# Patient Record
Sex: Male | Born: 1990 | Race: Black or African American | Hispanic: No | Marital: Married | Smoking: Current every day smoker
Health system: Southern US, Community
[De-identification: ages and names within clinical notes are randomized; demographics above are authoritative.]

---
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
## 2012-03-13 ENCOUNTER — Emergency Department: Payer: Self-pay | Admitting: *Deleted

## 2015-07-10 ENCOUNTER — Encounter: Payer: Self-pay | Admitting: *Deleted

## 2015-07-10 ENCOUNTER — Emergency Department
Admission: EM | Admit: 2015-07-10 | Discharge: 2015-07-10 | Disposition: A | Discharge status: Home / Self Care | Payer: BLUE CROSS/BLUE SHIELD | Attending: Emergency Medicine | Admitting: Emergency Medicine

## 2015-07-10 DIAGNOSIS — F419 Anxiety disorder, unspecified: Secondary | ICD-10-CM | POA: Insufficient documentation

## 2015-07-10 DIAGNOSIS — G43809 Other migraine, not intractable, without status migrainosus: Secondary | ICD-10-CM | POA: Insufficient documentation

## 2015-07-10 DIAGNOSIS — G43909 Migraine, unspecified, not intractable, without status migrainosus: Secondary | ICD-10-CM | POA: Diagnosis present

## 2015-07-10 DIAGNOSIS — Z720 Tobacco use: Secondary | ICD-10-CM | POA: Insufficient documentation

## 2015-07-10 MED ORDER — ONDANSETRON HCL 4 MG PO TABS: 4.0000 mg | ORAL_TABLET | Freq: Three times a day (TID) | ORAL | Status: DC | PRN

## 2015-07-10 MED ORDER — KETOROLAC TROMETHAMINE 10 MG PO TABS: 10.0000 mg | ORAL_TABLET | Freq: Three times a day (TID) | ORAL | Status: DC | PRN

## 2015-07-10 MED ORDER — ONDANSETRON 4 MG PO TBDP
4.0000 mg | ORAL_TABLET | Freq: Once | ORAL | Status: AC
  Administered 2015-07-10: 4 mg via ORAL

## 2015-07-10 MED ORDER — ONDANSETRON 4 MG PO TBDP
ORAL_TABLET | ORAL | Status: AC
  Filled 2015-07-10: qty 1

## 2015-07-10 MED ORDER — ACETAMINOPHEN 500 MG PO TABS
ORAL_TABLET | ORAL | Status: AC
  Filled 2015-07-10: qty 2

## 2015-07-10 MED ORDER — LORAZEPAM 2 MG/ML IJ SOLN
INTRAMUSCULAR | Status: AC
  Filled 2015-07-10: qty 1

## 2015-07-10 MED ORDER — ACETAMINOPHEN 500 MG PO TABS
1000.0000 mg | ORAL_TABLET | Freq: Once | ORAL | Status: AC
  Administered 2015-07-10: 1000 mg via ORAL

## 2015-07-10 MED ORDER — LORAZEPAM 2 MG/ML IJ SOLN
1.0000 mg | Freq: Once | INTRAMUSCULAR | Status: AC
  Administered 2015-07-10: 1 mg via INTRAMUSCULAR

## 2015-07-10 NOTE — ED Provider Notes (Signed)
[facility] Emergency Department Provider Note  ____________________________________________  Time seen: Approximately 9:30 PM  I have reviewed the triage vital signs and the nursing notes.   HISTORY  Chief Complaint Migraine    HPI Toribio Harbourashawn Mccowan is a 24 y.o. male , with a history of migraine, presenting for migraine. Patient reports that around 8 PM he had a progressively worsening sharp headache that is "all over." He denies any visual changes, changes in speech or gait, numbness, tingling, weakness. No recent trauma or fever, no tick bites. He took 800 mg of Motrin at home and his headache has gone from 10 out of 10-8 out of 10. He has some associated nausea but no vomiting.No photo or phonophobia.   History reviewed. No pertinent past medical history.  There are no active problems to display for this patient.   History reviewed. No pertinent past surgical history.  Current Outpatient Rx  Name  Route  Sig  Dispense  Refill  . ketorolac (TORADOL) 10 MG tablet   Oral   Take 1 tablet (10 mg total) by mouth every 8 (eight) hours as needed for moderate pain (with food).   15 tablet   0   . ondansetron (ZOFRAN) 4 MG tablet   Oral   Take 1 tablet (4 mg total) by mouth every 8 (eight) hours as needed for nausea or vomiting.   15 tablet   0     Family history; no family history of cerebral aneurysm, no family history of brain tumors or malignancies.   Allergies Review of patient's allergies indicates no known allergies.  No family history on file.  Social History Social History  Substance Use Topics  . Smoking status: Current Every Day Smoker  . Smokeless tobacco: None  . Alcohol Use: No    Review of Systems Constitutional: No fever/chills. No syncope. Eyes: No visual changes. No blurred or double vision. ENT: No sore throat. Neck; no neck stiffness or pain. Cardiovascular: Denies chest pain, palpitations. Respiratory: Denies  shortness of breath.  No cough. Gastrointestinal: No abdominal pain.  Positive nausea, no vomiting.  No diarrhea.  No constipation. Genitourinary: Negative for dysuria. Musculoskeletal: Negative for back pain. Skin: Negative for rash. Neurological: Positive for headaches, negative for focal weakness or numbness. No changes in gait. Psych: Patient reports anxiety which she reports is normal during migraine.  10-point ROS otherwise negative.  ____________________________________________   PHYSICAL EXAM:  VITAL SIGNS: ED Triage Vitals  Enc Vitals Group     BP 07/10/15 2116 127/69 mmHg     Pulse Rate 07/10/15 2116 88     Resp 07/10/15 2116 30     Temp 07/10/15 2116 98.4 F (36.9 C)     Temp Source 07/10/15 2116 Oral     SpO2 07/10/15 2116 99 %     Weight 07/10/15 2116 240 lb (108.863 kg)     Height 07/10/15 2116 6' (1.829 m)     Head Cir --      Peak Flow --      Pain Score 07/10/15 2114 10     Pain Loc --      Pain Edu? --      Excl. in GC? --      Constitutional: Patient is alert and oriented and answering questions appropriately.  He appears anxious and fidgety. Eyes: Conjunctivae are normal.  EOMI. PERRLA. No scleral icterus. Head: Atraumatic. No raccoon eyes or Battle sign. Nose: No congestion/rhinnorhea. Mouth/Throat: Mucous membranes are moist.  Neck:  No stridor.  Supple.  No neck stiffness. Full range of motion without pain. No meningismus. Cardiovascular: Normal rate, regular rhythm. No murmurs, rubs or gallops.  Respiratory: Normal respiratory effort.  No retractions. Lungs CTAB.  No wheezes, rales or ronchi. Musculoskeletal: No LE edema.  Neurologic:  Alert and oriented 3. Speech is clear. Face and smile symmetric. EOMI. PERRLA. Symmetric eyebrow raise. Good strength in all the extremities. No ataxia with walking; normal gait. Skin:  Skin is warm, dry and intact. No rash noted. Psychiatric: Anxious affect.  Normal  judgement  ____________________________________________   LABS (all labs ordered are listed, but only abnormal results are displayed)  Labs Reviewed - No data to display ____________________________________________  EKG  Not indicated ____________________________________________  RADIOLOGY  No results found.  ____________________________________________   PROCEDURES  Procedure(s) performed: None  Critical Care performed: No ____________________________________________   INITIAL IMPRESSION / ASSESSMENT AND PLAN / ED COURSE  Pertinent labs & imaging results that were available during my care of the patient were reviewed by me and considered in my medical decision making (see chart for details).  24 y.o. male with a history of migraines presenting with a headache typical for migraine that is already improving with ibuprofen that he took at home. Clinically, that he has no neurologic deficits. No history of trauma. Intracranial hemorrhage or subarachnoid hemorrhage is very unlikely. Intracranial malignancy is also unlikely. There is no clinical or historical evidence for infection such as meningitis. I will attempt to treat his migraine, if it improves anticipate discharge home.  ----------------------------------------- 10:11 PM on 07/10/2015 -----------------------------------------  Patient's mother and brother here. They do report a family history of migraines. The patient's headache is improving and he is tolerating by mouth. I will plan discharge with close PMD follow-up. ____________________________________________  FINAL CLINICAL IMPRESSION(S) / ED DIAGNOSES  Final diagnoses:  Other migraine without status migrainosus, not intractable      NEW MEDICATIONS STARTED DURING THIS VISIT:  New Prescriptions   KETOROLAC (TORADOL) 10 MG TABLET    Take 1 tablet (10 mg total) by mouth every 8 (eight) hours as needed for moderate pain (with food).   ONDANSETRON  (ZOFRAN) 4 MG TABLET    Take 1 tablet (4 mg total) by mouth every 8 (eight) hours as needed for nausea or vomiting.     Rockne Menghini, MD 07/10/15 2211

## 2015-07-10 NOTE — Discharge Instructions (Signed)
Please drink plenty of fluid to stay well hydrated. Please get plenty of rest. If you begin to develop a migraine, it is important to take pain medication early. Please take Toradol for pain, with food. If you're taking Toradol do not take other NSAID medication such as ibuprofen because it can irritate her stomach or cause bleeding. You may take Zofran for nausea. Next  Please return to the emergency department for worsening headache, nausea or vomiting, fever, changes in vision, difficulty walking, numbness, tingling, weakness, or any other symptoms concerning to you.  Migraine Headache A migraine headache is an intense, throbbing pain on one or both sides of your head. A migraine can last for 30 minutes to several hours. CAUSES  The exact cause of a migraine headache is not always known. However, a migraine may be caused when nerves in the brain become irritated and release chemicals that cause inflammation. This causes pain. Certain things may also trigger migraines, such as:  Alcohol.  Smoking.  Stress.  Menstruation.  Aged cheeses.  Foods or drinks that contain nitrates, glutamate, aspartame, or tyramine.  Lack of sleep.  Chocolate.  Caffeine.  Hunger.  Physical exertion.  Fatigue.  Medicines used to treat chest pain (nitroglycerine), birth control pills, estrogen, and some blood pressure medicines. SIGNS AND SYMPTOMS  Pain on one or both sides of your head.  Pulsating or throbbing pain.  Severe pain that prevents daily activities.  Pain that is aggravated by any physical activity.  Nausea, vomiting, or both.  Dizziness.  Pain with exposure to bright lights, loud noises, or activity.  General sensitivity to bright lights, loud noises, or smells. Before you get a migraine, you may get warning signs that a migraine is coming (aura). An aura may include:  Seeing flashing lights.  Seeing bright spots, halos, or zigzag lines.  Having tunnel vision or blurred  vision.  Having feelings of numbness or tingling.  Having trouble talking.  Having muscle weakness. DIAGNOSIS  A migraine headache is often diagnosed based on:  Symptoms.  Physical exam.  A CT scan or MRI of your head. These imaging tests cannot diagnose migraines, but they can help rule out other causes of headaches. TREATMENT Medicines may be given for pain and nausea. Medicines can also be given to help prevent recurrent migraines.  HOME CARE INSTRUCTIONS  Only take over-the-counter or prescription medicines for pain or discomfort as directed by your health care provider. The use of long-term narcotics is not recommended.  Lie down in a dark, quiet room when you have a migraine.  Keep a journal to find out what may trigger your migraine headaches. For example, write down:  What you eat and drink.  How much sleep you get.  Any change to your diet or medicines.  Limit alcohol consumption.  Quit smoking if you smoke.  Get 7-9 hours of sleep, or as recommended by your health care provider.  Limit stress.  Keep lights dim if bright lights bother you and make your migraines worse. SEEK IMMEDIATE MEDICAL CARE IF:   Your migraine becomes severe.  You have a fever.  You have a stiff neck.  You have vision loss.  You have muscular weakness or loss of muscle control.  You start losing your balance or have trouble walking.  You feel faint or pass out.  You have severe symptoms that are different from your first symptoms. MAKE SURE YOU:   Understand these instructions.  Will watch your condition.  Will get help  right away if you are not doing well or get worse.   This information is not intended to replace advice given to you by your health care provider. Make sure you discuss any questions you have with your health care provider.   Document Released: 09/12/2005 Document Revised: 10/03/2014 Document Reviewed: 05/20/2013 Elsevier Interactive Patient Education  [employer]

## 2015-07-10 NOTE — ED Notes (Addendum)
Pt reports migraine, but worse this time. Reports nausea. Pt hyperventilating in triage.

## 2015-08-12 ENCOUNTER — Emergency Department
Admission: EM | Admit: 2015-08-12 | Discharge: 2015-08-12 | Disposition: A | Discharge status: Home / Self Care | Payer: BLUE CROSS/BLUE SHIELD | Attending: Emergency Medicine | Admitting: Emergency Medicine

## 2015-08-12 ENCOUNTER — Encounter: Payer: Self-pay | Admitting: Intensive Care

## 2015-08-12 DIAGNOSIS — G43709 Chronic migraine without aura, not intractable, without status migrainosus: Secondary | ICD-10-CM | POA: Insufficient documentation

## 2015-08-12 DIAGNOSIS — F17210 Nicotine dependence, cigarettes, uncomplicated: Secondary | ICD-10-CM | POA: Diagnosis not present

## 2015-08-12 DIAGNOSIS — G43909 Migraine, unspecified, not intractable, without status migrainosus: Secondary | ICD-10-CM | POA: Diagnosis present

## 2015-08-12 MED ORDER — DIPHENHYDRAMINE HCL 50 MG/ML IJ SOLN
50.0000 mg | Freq: Once | INTRAMUSCULAR | Status: AC
  Administered 2015-08-12: 50 mg via INTRAMUSCULAR
  Filled 2015-08-12: qty 1

## 2015-08-12 MED ORDER — PROMETHAZINE HCL 25 MG/ML IJ SOLN
25.0000 mg | Freq: Once | INTRAMUSCULAR | Status: AC
  Administered 2015-08-12: 25 mg via INTRAMUSCULAR
  Filled 2015-08-12: qty 1

## 2015-08-12 MED ORDER — ASPIRIN-ACETAMINOPHEN-CAFFEINE 250-250-65 MG PO TABS: 1.0000 | ORAL_TABLET | Freq: Four times a day (QID) | ORAL | Status: DC | PRN

## 2015-08-12 MED ORDER — KETOROLAC TROMETHAMINE 30 MG/ML IJ SOLN
60.0000 mg | Freq: Once | INTRAMUSCULAR | Status: AC
  Administered 2015-08-12: 60 mg via INTRAMUSCULAR
  Filled 2015-08-12: qty 2

## 2015-08-12 NOTE — Discharge Instructions (Signed)

## 2015-08-12 NOTE — ED Provider Notes (Signed)
[facility] Emergency Department Provider Note  ____________________________________________  Time seen: Approximately 6:38 PM  I have reviewed the triage vital signs and the nursing notes.   HISTORY  Chief Complaint Migraine    HPI Randy Gillespie is a 24 y.o. male who presents to emergency department complaining of a headache. The patient states that he has a history of routine migraines and states that he ran out of his regular medication for same. He presents with a global headache. He denies any neck pain, trauma, nausea or vomiting, numbness or tingling, visual acuity changes. He states the pain is moderate, constant, and is not improving with Tylenol or ibuprofen. She states he routinely takes Excedrin but is out of same.   History reviewed. No pertinent past medical history.  There are no active problems to display for this patient.   History reviewed. No pertinent past surgical history.  Current Outpatient Rx  Name  Route  Sig  Dispense  Refill  . aspirin-acetaminophen-caffeine (EXCEDRIN MIGRAINE) 250-250-65 MG tablet   Oral   Take 1 tablet by mouth every 6 (six) hours as needed for headache.   30 tablet   0   . ketorolac (TORADOL) 10 MG tablet   Oral   Take 1 tablet (10 mg total) by mouth every 8 (eight) hours as needed for moderate pain (with food).   15 tablet   0   . ondansetron (ZOFRAN) 4 MG tablet   Oral   Take 1 tablet (4 mg total) by mouth every 8 (eight) hours as needed for nausea or vomiting.   15 tablet   0     Allergies Review of patient's allergies indicates no known allergies.  History reviewed. No pertinent family history.  Social History Social History  Substance Use Topics  . Smoking status: Current Every Day Smoker -- 1.00 packs/day    Types: Cigarettes  . Smokeless tobacco: Never Used  . Alcohol Use: Yes     Comment: Occ    Review of Systems Constitutional: No fever/chills Eyes: No visual  changes. ENT: No sore throat. Cardiovascular: Denies chest pain. Respiratory: Denies shortness of breath. Gastrointestinal: No abdominal pain.  No nausea, no vomiting.  No diarrhea.  No constipation. Genitourinary: Negative for dysuria. Musculoskeletal: Negative for back pain. Skin: Negative for rash. Neurological: Endorses headaches but denies focal weakness or numbness.  10-point ROS otherwise negative.  ____________________________________________   PHYSICAL EXAM:  VITAL SIGNS: ED Triage Vitals  Enc Vitals Group     BP 08/12/15 1835 145/59 mmHg     Pulse Rate 08/12/15 1835 63     Resp 08/12/15 1835 16     Temp 08/12/15 1835 98.4 F (36.9 C)     Temp Source 08/12/15 1835 Oral     SpO2 08/12/15 1835 99 %     Weight --      Height --      Head Cir --      Peak Flow --      Pain Score 08/12/15 1836 6     Pain Loc --      Pain Edu? --      Excl. in GC? --     Constitutional: Alert and oriented. Well appearing and in no acute distress. Eyes: Conjunctivae are normal. PERRL. EOMI. Head: Atraumatic. Nose: No congestion/rhinnorhea. Mouth/Throat: Mucous membranes are moist.  Oropharynx non-erythematous. Neck: No stridor.  No cervical spine tenderness to palpation. Cardiovascular: Normal rate, regular rhythm. Grossly normal heart sounds.  Good peripheral circulation.  Respiratory: Normal respiratory effort.  No retractions. Lungs CTAB. Gastrointestinal: Soft and nontender. No distention. No abdominal bruits. No CVA tenderness. Musculoskeletal: No lower extremity tenderness nor edema.  No joint effusions. Neurologic:  Normal speech and language. No gross focal neurologic deficits are appreciated. No gait instability. Radial nerves II through XII grossly intact. Skin:  Skin is warm, dry and intact. No rash noted. Psychiatric: Mood and affect are normal. Speech and behavior are normal.  ____________________________________________   LABS (all labs ordered are listed, but only  abnormal results are displayed)  Labs Reviewed - No data to display ____________________________________________  EKG   ____________________________________________  RADIOLOGY   ____________________________________________   PROCEDURES  Procedure(s) performed: None  Critical Care performed: No  ____________________________________________   INITIAL IMPRESSION / ASSESSMENT AND PLAN / ED COURSE  Pertinent labs & imaging results that were available during my care of the patient were reviewed by me and considered in my medical decision making (see chart for details).  She is history, symptoms, physical exam and taken into consideration for diagnosis. Patient's diagnosis is consistent with migraine headache and I advised the patient that we would provide IM injections here in the emergency department for symptomatic relief. The patient is to follow-up with neurology for further evaluation of recurring migraines. Patient is to refill his Excedrin for daily use until seeing neurology. Patient verbalizes understanding of the diagnosis and treatment plan and verbalizes compliance with same. ____________________________________________   FINAL CLINICAL IMPRESSION(S) / ED DIAGNOSES  Final diagnoses:  Chronic migraine without aura without status migrainosus, not intractable      Racheal PatchesJonathan D Norberta Stobaugh, PA-C 08/12/15 1928  Phineas SemenGraydon Goodman, MD 08/12/15 2022

## 2015-08-12 NOTE — ED Notes (Signed)
Patient c/o migraine since this morning. Patient states he was here about a month ago for the same. Patient ambulated back to RM with NAD noted

## 2016-01-04 ENCOUNTER — Emergency Department
Admission: EM | Admit: 2016-01-04 | Discharge: 2016-01-04 | Disposition: A | Discharge status: Home / Self Care | Payer: BLUE CROSS/BLUE SHIELD | Attending: Emergency Medicine | Admitting: Emergency Medicine

## 2016-01-04 ENCOUNTER — Emergency Department: Payer: BLUE CROSS/BLUE SHIELD

## 2016-01-04 ENCOUNTER — Encounter: Payer: Self-pay | Admitting: Emergency Medicine

## 2016-01-04 DIAGNOSIS — F17210 Nicotine dependence, cigarettes, uncomplicated: Secondary | ICD-10-CM | POA: Insufficient documentation

## 2016-01-04 DIAGNOSIS — R079 Chest pain, unspecified: Secondary | ICD-10-CM | POA: Insufficient documentation

## 2016-01-04 DIAGNOSIS — F1210 Cannabis abuse, uncomplicated: Secondary | ICD-10-CM | POA: Insufficient documentation

## 2016-01-04 LAB — CBC
HEMATOCRIT: 44.8 % (ref 40.0–52.0)
HEMOGLOBIN: 14.2 g/dL (ref 13.0–18.0)
MCH: 23.7 pg — ABNORMAL LOW (ref 26.0–34.0)
MCHC: 31.7 g/dL — ABNORMAL LOW (ref 32.0–36.0)
MCV: 74.7 fL — AB (ref 80.0–100.0)
Platelets: 305 10*3/uL (ref 150–440)
RBC: 6 MIL/uL — AB (ref 4.40–5.90)
RDW: 15 % — ABNORMAL HIGH (ref 11.5–14.5)
WBC: 13.5 10*3/uL — AB (ref 3.8–10.6)

## 2016-01-04 LAB — BASIC METABOLIC PANEL
ANION GAP: 9 (ref 5–15)
BUN: 15 mg/dL (ref 6–20)
CHLORIDE: 106 mmol/L (ref 101–111)
CO2: 24 mmol/L (ref 22–32)
Calcium: 9.8 mg/dL (ref 8.9–10.3)
Creatinine, Ser: 1.12 mg/dL (ref 0.61–1.24)
GFR calc non Af Amer: >60 mL/min (ref >60)
Glucose, Bld: 100 mg/dL — ABNORMAL HIGH (ref 65–99)
POTASSIUM: 3.5 mmol/L (ref 3.5–5.1)
SODIUM: 139 mmol/L (ref 135–145)

## 2016-01-04 LAB — TROPONIN I: Troponin I: <0.03 ng/mL (ref <0.031)

## 2016-01-04 MED ORDER — IBUPROFEN 800 MG PO TABS: 800.0000 mg | ORAL_TABLET | Freq: Three times a day (TID) | ORAL | Status: DC | PRN

## 2016-01-04 MED ORDER — OXYCODONE-ACETAMINOPHEN 5-325 MG PO TABS
2.0000 | ORAL_TABLET | Freq: Once | ORAL | Status: AC
  Administered 2016-01-04: 2 via ORAL
  Filled 2016-01-04: qty 2

## 2016-01-04 MED ORDER — DIAZEPAM 5 MG PO TABS: 5.0000 mg | ORAL_TABLET | Freq: Three times a day (TID) | ORAL | Status: DC | PRN

## 2016-01-04 NOTE — ED Notes (Signed)
Patient presents to the ED with chest pain that began this morning.  Patient states pain has been constant since this morning.  Patient states he has a history of chest pain since having a pulled muscle when playing football.  Patient denies movement changing the pain, denies tenderness.  Patient is having no shortness of breath.  Patient states he has never been told he has any cardiac history.

## 2016-01-04 NOTE — Discharge Instructions (Signed)

## 2016-01-04 NOTE — ED Notes (Signed)
Patient with no complaints at this time. Respirations even and unlabored. Skin warm/dry. Discharge instructions reviewed with patient at this time. Patient given opportunity to voice concerns/ask questions. Patient discharged at this time and left Emergency Department with steady gait.   

## 2016-01-04 NOTE — ED Provider Notes (Signed)
[facility] Emergency Department Provider Note     Time seen: ----------------------------------------- 7:43 PM on 01/04/2016 -----------------------------------------    I have reviewed the triage vital signs and the nursing notes.   HISTORY  Chief Complaint Chest Pain    HPI Randy Gillespie is a 25 y.o. male who presents ER for chest pain that began this morning. Patient states pain is been constant, states she has a history of chest pain since having a pulled muscle and pain football. Patient denies movement changing the pain, denies tenderness. She denies shortness of breath   History reviewed. No pertinent past medical history.  There are no active problems to display for this patient.   History reviewed. No pertinent past surgical history.  Allergies Review of patient's allergies indicates no known allergies.  Social History Social History  Substance Use Topics  . Smoking status: Current Every Day Smoker -- 1.00 packs/day    Types: Cigarettes  . Smokeless tobacco: Never Used  . Alcohol Use: Yes     Comment: Occ    Review of Systems Constitutional: Negative for fever. Eyes: Negative for visual changes. ENT: Negative for sore throat. Cardiovascular: Positive for chest pain Respiratory: Negative for shortness of breath. Gastrointestinal: Negative for abdominal pain, vomiting and diarrhea. Genitourinary: Negative for dysuria. Musculoskeletal: Negative for back pain. Skin: Negative for rash. Neurological: Negative for headaches, focal weakness or numbness.  10-point ROS otherwise negative.  ____________________________________________   PHYSICAL EXAM:  VITAL SIGNS: ED Triage Vitals  Enc Vitals Group     BP 01/04/16 1928 134/58 mmHg     Pulse Rate 01/04/16 1928 77     Resp 01/04/16 1928 20     Temp 01/04/16 1928 97.6 F (36.4 C)     Temp Source 01/04/16 1928 Oral     SpO2 01/04/16 1928 97 %     Weight 01/04/16 1928 199  lb 8 oz (90.493 kg)     Height 01/04/16 1928 6' (1.829 m)     Head Cir --      Peak Flow --      Pain Score 01/04/16 1929 6     Pain Loc --      Pain Edu? --      Excl. in GC? --    Constitutional: Alert and oriented. Well appearing and in no distress. Eyes: Conjunctivae are normal. PERRL. Normal extraocular movements. ENT   Head: Normocephalic and atraumatic.   Nose: No congestion/rhinnorhea.   Mouth/Throat: Mucous membranes are moist.   Neck: No stridor. Cardiovascular: Normal rate, regular rhythm. No murmurs, rubs, or gallops. Respiratory: Normal respiratory effort without tachypnea nor retractions. Breath sounds are clear and equal bilaterally. No wheezes/rales/rhonchi. Gastrointestinal: Soft and nontender. Normal bowel sounds Musculoskeletal: Nontender with normal range of motion in all extremities. No lower extremity tenderness nor edema. Neurologic:  Normal speech and language. No gross focal neurologic deficits are appreciated.  Skin:  Skin is warm, dry and intact. No rash noted. Psychiatric: Mood and affect are normal. Speech and behavior are normal.  ____________________________________________  EKG: Interpreted by me. Sinus rhythm with sinus arrhythmia, rate is 70 bpm, normal PR interval, normal QRS, normal QT interval. Nonspecific ST abnormality  ____________________________________________  ED COURSE:  Pertinent labs & imaging results that were available during my care of the patient were reviewed by me and considered in my medical decision making (see chart for details). Patient is in no acute distress, likely musculoskeletal. I will check basic labs and reevaluate. ____________________________________________  LABS (pertinent positives/negatives)  Labs Reviewed  BASIC METABOLIC PANEL - Abnormal; Notable for the following:    Glucose, Bld 100 (*)    All other components within normal limits  CBC - Abnormal; Notable for the following:    WBC 13.5  (*)    RBC 6.00 (*)    MCV 74.7 (*)    MCH 23.7 (*)    MCHC 31.7 (*)    RDW 15.0 (*)    All other components within normal limits  TROPONIN I    RADIOLOGY  Chest x-ray Is unremarkable ____________________________________________  FINAL ASSESSMENT AND PLAN  Chest pain  Plan: Patient with labs and imaging as dictated above. No specific etiology for his chest pain is identified. Likely musculoskeletal in origin. He'll be discharged with Motrin and Valium and encouraged to have close follow-up with a doctor.   Emily Filbert, MD   Emily Filbert, MD 01/04/16 [phone]

## 2016-01-04 NOTE — ED Notes (Signed)
Patient ambulatory to triage with steady gait, without difficulty or distress noted; pt reports left sided CP, nonradiating, with no accomp symptoms since noon

## 2016-09-18 ENCOUNTER — Encounter: Payer: Self-pay | Admitting: *Deleted

## 2016-09-18 ENCOUNTER — Emergency Department
Admission: EM | Admit: 2016-09-18 | Discharge: 2016-09-18 | Disposition: A | Discharge status: Home / Self Care | Payer: BLUE CROSS/BLUE SHIELD | Attending: Emergency Medicine | Admitting: Emergency Medicine

## 2016-09-18 ENCOUNTER — Emergency Department: Payer: BLUE CROSS/BLUE SHIELD

## 2016-09-18 DIAGNOSIS — S4991XA Unspecified injury of right shoulder and upper arm, initial encounter: Secondary | ICD-10-CM | POA: Diagnosis present

## 2016-09-18 DIAGNOSIS — W2101XA Struck by football, initial encounter: Secondary | ICD-10-CM | POA: Diagnosis not present

## 2016-09-18 DIAGNOSIS — F17210 Nicotine dependence, cigarettes, uncomplicated: Secondary | ICD-10-CM | POA: Insufficient documentation

## 2016-09-18 DIAGNOSIS — Y998 Other external cause status: Secondary | ICD-10-CM | POA: Diagnosis not present

## 2016-09-18 DIAGNOSIS — Y9361 Activity, american tackle football: Secondary | ICD-10-CM | POA: Insufficient documentation

## 2016-09-18 DIAGNOSIS — Y929 Unspecified place or not applicable: Secondary | ICD-10-CM | POA: Insufficient documentation

## 2016-09-18 MED ORDER — ONDANSETRON HCL 4 MG/2ML IJ SOLN
4.0000 mg | Freq: Once | INTRAMUSCULAR | Status: AC
  Administered 2016-09-18: 4 mg via INTRAVENOUS
  Filled 2016-09-18: qty 2

## 2016-09-18 MED ORDER — MORPHINE SULFATE (PF) 4 MG/ML IV SOLN
4.0000 mg | Freq: Once | INTRAVENOUS | Status: AC
  Administered 2016-09-18: 4 mg via INTRAVENOUS
  Filled 2016-09-18: qty 1

## 2016-09-18 MED ORDER — HYDROCODONE-ACETAMINOPHEN 5-325 MG PO TABS: 1.0000 | ORAL_TABLET | Freq: Four times a day (QID) | ORAL | 0 refills | Status: DC | PRN

## 2016-09-18 NOTE — ED Provider Notes (Addendum)
[facility] Emergency Department Provider Note  ____________________________________________   First MD Initiated Contact with Patient 09/18/16 1919     (approximate)  I have reviewed the triage vital signs and the nursing notes.   HISTORY  Chief Complaint Shoulder Injury   HPI Randy Gillespie is a 25 y.o. male without any chronic medical conditions was presenting to the emergency Department after right shoulder injury. He says that he was tackled playing football this evening and landed with all of his weight on his right shoulder. He says that he has been unable to range the arm and his right shoulder but distal to it he has full range of motion and sensation. He does not report any head injury or loss of consciousness. Says that he has not eaten anything today but had Gatorade and water to drink about 45 minutes prior to arrival.   History reviewed. No pertinent past medical history.  There are no active problems to display for this patient.   History reviewed. No pertinent surgical history.  Prior to Admission medications   Medication Sig Start Date End Date Taking? Authorizing Provider  aspirin-acetaminophen-caffeine (EXCEDRIN MIGRAINE) [phone] MG tablet Take 1 tablet by mouth every 6 (six) hours as needed for headache. 08/12/15   Delorise RoyalsJonathan D Cuthriell, PA-C  diazepam (VALIUM) 5 MG tablet Take 1 tablet (5 mg total) by mouth every 8 (eight) hours as needed for muscle spasms. 01/04/16   Emily FilbertJonathan E Williams, MD  ibuprofen (ADVIL,MOTRIN) 800 MG tablet Take 1 tablet (800 mg total) by mouth every 8 (eight) hours as needed. 01/04/16   Emily FilbertJonathan E Williams, MD  ketorolac (TORADOL) 10 MG tablet Take 1 tablet (10 mg total) by mouth every 8 (eight) hours as needed for moderate pain (with food). 07/10/15   Anne-Caroline Sharma CovertNorman, MD  ondansetron (ZOFRAN) 4 MG tablet Take 1 tablet (4 mg total) by mouth every 8 (eight) hours as needed for nausea or vomiting. 07/10/15    Rockne MenghiniAnne-Caroline Norman, MD    Allergies Patient has no known allergies.  History reviewed. No pertinent family history.  Social History Social History  Substance Use Topics  . Smoking status: Current Every Day Smoker    Packs/day: 1.00    Types: Cigarettes  . Smokeless tobacco: Never Used  . Alcohol use Yes     Comment: Occ    Review of Systems Constitutional: No fever/chills Eyes: No visual changes. ENT: No sore throat. Cardiovascular: Denies chest pain. Respiratory: Denies shortness of breath. Gastrointestinal: No abdominal pain.  No nausea, no vomiting.  No diarrhea.  No constipation. Genitourinary: Negative for dysuria. Musculoskeletal: Negative for back pain. Skin: Negative for rash. Neurological: Negative for headaches, focal weakness or numbness.  10-point ROS otherwise negative.  ____________________________________________   PHYSICAL EXAM:  VITAL SIGNS: ED Triage Vitals  Enc Vitals Group     BP 09/18/16 1911 113/77     Pulse Rate 09/18/16 1911 100     Resp 09/18/16 1911 20     Temp 09/18/16 1911 99.5 F (37.5 C)     Temp Source 09/18/16 1911 Oral     SpO2 09/18/16 1911 100 %     Weight 09/18/16 1912 240 lb (108.9 kg)     Height 09/18/16 1912 6' (1.829 m)     Head Circumference --      Peak Flow --      Pain Score 09/18/16 1913 6     Pain Loc --      Pain Edu? --  Excl. in GC? --     Constitutional: Alert and oriented. Well appearing and in no acute distress. Eyes: Conjunctivae are normal. PERRL. EOMI. Head: Atraumatic. Nose: No congestion/rhinnorhea. Mouth/Throat: Mucous membranes are moist.   Neck: No stridor.   Cardiovascular: Normal rate, regular rhythm. Grossly normal heart sounds.  Good peripheral circulation With intact right radial pulse. 5 out of 5 strength to squeeze to the right hand. Also with sensation and that is intact to light touch over the right deltoid. Respiratory: Normal respiratory effort.  No retractions. Lungs  CTAB. Gastrointestinal: Soft and nontender. No distention.  No CVA tenderness. Musculoskeletal: Right shoulder tenderness to palpation over the right ac joint. No lateral tenderness. Also tenderness over the supraspinatus muscle. Unable to adduct. No obvious deformity.  Neurovascularly intact distal to the site of the injury. Neurologic:  Normal speech and language. No gross focal neurologic deficits are appreciated.  Skin:  Skin is warm, dry and intact. No rash noted. Psychiatric: Mood and affect are normal. Speech and behavior are normal.  ____________________________________________   LABS (all labs ordered are listed, but only abnormal results are displayed)  Labs Reviewed - No data to display ____________________________________________  EKG   ____________________________________________  RADIOLOGY  DG Shoulder Right (Final result)  Result time 09/18/16 20:39:49  Final result by Herbie Baltimore, MD (09/18/16 20:39:49)           Narrative:   CLINICAL DATA: Football injury of the shoulder today with upper shoulder pain and painful range of motion.  EXAM: RIGHT SHOULDER - 2+ VIEW  COMPARISON: 01/04/2016  FINDINGS: The distal clavicle sits slightly high with respect to the acromion raises suspicion for a [place] II AC joint injury. Correlate with any point tenderness directly over the [facility] joint.  No fracture seen. Glenohumeral alignment normal.  IMPRESSION: 1. Distal clavicle sits slightly high with respect to the acromion, suspicious for a [place] II injury. Correlate with any tenderness directly over the [facility] joint.   Electronically Signed By: Gaylyn Rong M.D. On: 09/18/2016 20:39            ____________________________________________   PROCEDURES  Procedure(s) performed:   Procedures  Critical Care performed:   ____________________________________________   INITIAL IMPRESSION / ASSESSMENT AND PLAN / ED COURSE  Pertinent  labs & imaging results that were available during my care of the patient were reviewed by me and considered in my medical decision making (see chart for details).  Clinical Course   ----------------------------------------- 9:01 PM on 09/18/2016 -----------------------------------------  Patient with what appears to be an [facility] joint injury.  After morphine he does not report any pain when he is not moving the arm. We discussed icing for 48 hours and then the ability to use heat afterwards. We also discussed using a salve such as icy hot or Aspercreme. He'll be given a prescription for Norco as needed. He also knows that he will need follow-up with orthopedics for further evaluation and possible physical therapy. The patient also works Environmental manager for a living. I will be giving him a work note for the next 2 days and told him that he'll need to follow-up with orthopedics for any longer out of work note for medications. He is understanding of the plan and willing to comply.   ____________________________________________   FINAL CLINICAL IMPRESSION(S) / ED DIAGNOSES  Right acromioclavicular joint injury.    NEW MEDICATIONS STARTED DURING THIS VISIT:  New Prescriptions   No medications on file     Note:  This document  was prepared using Conservation officer, historic buildingsDragon voice recognition software and may include unintentional dictation errors.    Myrna Blazeravid Matthew Aisa Schoeppner, MD 09/18/16 2104  Patient says that he has tried Percocet in the past and they have not been effective. He is skeptical about an opioid prescription. I told him that we would give a very small amount of Norco just for breakthrough pain. I'll give him 6 tabs. He says that he also has ibuprofen 600 mg tablet at home for use.    Myrna Blazeravid Matthew Stacy Deshler, MD 09/18/16 2108

## 2016-09-18 NOTE — ED Triage Notes (Signed)
Pt playing football, sustained R shoulder injury Decreased ROM in R arm. Pt presents w/ stepoff to R shoulder. Pt has strong grip and strong palpable pulses. Last had PO food yesterday. Last PO fluid at 1730 today.

## 2017-02-13 ENCOUNTER — Emergency Department
Admission: EM | Admit: 2017-02-13 | Discharge: 2017-02-13 | Disposition: A | Discharge status: Home / Self Care | Payer: 59 | Attending: Emergency Medicine | Admitting: Emergency Medicine

## 2017-02-13 ENCOUNTER — Emergency Department: Payer: 59

## 2017-02-13 ENCOUNTER — Encounter: Payer: Self-pay | Admitting: Emergency Medicine

## 2017-02-13 DIAGNOSIS — G8929 Other chronic pain: Secondary | ICD-10-CM | POA: Insufficient documentation

## 2017-02-13 DIAGNOSIS — M25561 Pain in right knee: Secondary | ICD-10-CM | POA: Diagnosis present

## 2017-02-13 DIAGNOSIS — F17210 Nicotine dependence, cigarettes, uncomplicated: Secondary | ICD-10-CM | POA: Diagnosis not present

## 2017-02-13 MED ORDER — ETODOLAC 400 MG PO TABS: 400.0000 mg | ORAL_TABLET | Freq: Two times a day (BID) | ORAL | 0 refills | Status: DC

## 2017-02-13 NOTE — ED Triage Notes (Signed)
R knee pain x 1 1/2 year, denies injury.

## 2017-02-13 NOTE — ED Provider Notes (Signed)
[facility] Emergency Department Provider Note ____________________________________________  Time seen: 8:51 AM  I have reviewed the triage vital signs and the nursing notes.  HISTORY  Chief Complaint  Knee Pain   HPI Randy Gillespie is a 26 y.o. male is here complaining of right knee pain for 1 1/2 years.  Patient denies any injury. He states that he rarely does he take any over-the-counter medication for his pain. He states last time he took ibuprofen was a month ago. He has not seen his PCP for his chronic knee pain. Patient continues to ambulate without assistance. Patient rates his pain as 6 out of 10 at this time.   History reviewed. No pertinent past medical history.  There are no active problems to display for this patient.   History reviewed. No pertinent surgical history.  Prior to Admission medications   Medication Sig Start Date End Date Taking? Authorizing Provider  etodolac (LODINE) 400 MG tablet Take 1 tablet (400 mg total) by mouth 2 (two) times daily. 02/13/17   Randy RumpsSummers, Paloma Grange L, PA-C    Allergies Patient has no known allergies.  No family history on file.  Social History Social History  Substance Use Topics  . Smoking status: Current Every Day Smoker    Packs/day: 1.00    Types: Cigarettes  . Smokeless tobacco: Never Used  . Alcohol use Yes     Comment: Occ    Review of Systems  Constitutional: Negative for fever. Cardiovascular: Negative for chest pain. Respiratory: Negative for shortness of breath. Musculoskeletal: Positive for chronic knee pain. Skin: Negative for rash. Neurological: Negative for headaches, focal weakness or numbness. ____________________________________________  PHYSICAL EXAM:  VITAL SIGNS: ED Triage Vitals  Enc Vitals Group     BP 02/13/17 0825 (!) 114/50     Pulse Rate 02/13/17 0825 88     Resp 02/13/17 0825 20     Temp 02/13/17 0825 98.2 F (36.8 C)     Temp Source 02/13/17 0825 Oral     SpO2 02/13/17 0825 99 %     Weight 02/13/17 0827 250 lb (113.4 kg)     Height 02/13/17 0827 6' (1.829 m)     Head Circumference --      Peak Flow --      Pain Score 02/13/17 0825 6     Pain Loc --      Pain Edu? --      Excl. in GC? --     Constitutional: Alert and oriented. Well appearing and in no distress. Head: Normocephalic and atraumatic. Cardiovascular: Normal rate, regular rhythm. Normal distal pulses. Respiratory: Normal respiratory effort. No wheezes/rales/rhonchi. Musculoskeletal: On examination of the right knee and there is no gross deformity and no effusion is present. There is no crepitus with range of motion. Range of motion is minimally restricted secondary to patient's pain. Ligaments are stable bilaterally. No skin this elevation is noted. Neurologic:  Normal gait without ataxia. Normal speech and language. No gross focal neurologic deficits are appreciated. Skin:  Skin is warm, dry and intact.  Psychiatric: Mood and affect are normal. Patient exhibits appropriate insight and judgment.  RADIOLOGY Right knee x-ray per radiologist is negative I, Randy Gillespie, personally viewed and evaluated these images (plain radiographs) as part of my medical decision making, as well as reviewing the written report by the radiologist.   INITIAL IMPRESSION / ASSESSMENT AND PLAN / ED COURSE  Patient was given a prescription for etodolac 400 mg twice a day with  food. He is placed in a knee immobilizer for support. He is to follow-up with Dr. Hyacinth Meeker for any continued knee pain. He will ice and elevate as needed for pain.   ____________________________________________  FINAL CLINICAL IMPRESSION(S) / ED DIAGNOSES  Final diagnoses:  Chronic pain of right knee     Randy Rumps, PA-C 02/13/17 1449    Emily Filbert, MD 02/13/17 [phone]

## 2017-02-13 NOTE — ED Notes (Signed)
See triage note  States he developed right knee pain about 1 1/2 years ago  Unsure of injury  States pain became worse about 1 am  No swelling noted  Ambulates well to treatment area

## 2017-02-13 NOTE — ED Notes (Signed)
Pt alert and oriented X4, active, cooperative, pt in NAD. RR even and unlabored, color WNL.  Pt informed to return if any life threatening symptoms occur.   

## 2017-02-13 NOTE — Discharge Instructions (Signed)
Follow-up with Dr. Hyacinth MeekerMiller for any continued knee pain. Begin taking etodolac 400 mg twice a day with food. Wear knee immobilizer when up walking. You do not have to wear this while sleeping. Ice and elevate knee when needed.

## 2017-04-21 ENCOUNTER — Emergency Department: Payer: 59

## 2017-04-21 ENCOUNTER — Encounter: Payer: Self-pay | Admitting: Emergency Medicine

## 2017-04-21 ENCOUNTER — Emergency Department
Admission: EM | Admit: 2017-04-21 | Discharge: 2017-04-21 | Disposition: A | Discharge status: Home / Self Care | Payer: 59 | Attending: Student in an Organized Health Care Education/Training Program | Admitting: Student in an Organized Health Care Education/Training Program

## 2017-04-21 DIAGNOSIS — F439 Reaction to severe stress, unspecified: Secondary | ICD-10-CM

## 2017-04-21 DIAGNOSIS — F17210 Nicotine dependence, cigarettes, uncomplicated: Secondary | ICD-10-CM | POA: Insufficient documentation

## 2017-04-21 DIAGNOSIS — Z79899 Other long term (current) drug therapy: Secondary | ICD-10-CM | POA: Insufficient documentation

## 2017-04-21 DIAGNOSIS — R079 Chest pain, unspecified: Secondary | ICD-10-CM | POA: Diagnosis not present

## 2017-04-21 LAB — TROPONIN I

## 2017-04-21 LAB — CBC
HCT: 46.1 % (ref 40.0–52.0)
Hemoglobin: 15.2 g/dL (ref 13.0–18.0)
MCH: 23.9 pg — ABNORMAL LOW (ref 26.0–34.0)
MCHC: 32.9 g/dL (ref 32.0–36.0)
MCV: 72.8 fL — AB (ref 80.0–100.0)
PLATELETS: 276 10*3/uL (ref 150–440)
RBC: 6.34 MIL/uL — AB (ref 4.40–5.90)
RDW: 15.2 % — AB (ref 11.5–14.5)
WBC: 10 10*3/uL (ref 3.8–10.6)

## 2017-04-21 LAB — BASIC METABOLIC PANEL
Anion gap: 10 (ref 5–15)
BUN: 14 mg/dL (ref 6–20)
CALCIUM: 10.1 mg/dL (ref 8.9–10.3)
CHLORIDE: 103 mmol/L (ref 101–111)
CO2: 26 mmol/L (ref 22–32)
CREATININE: 1.1 mg/dL (ref 0.61–1.24)
GFR calc non Af Amer: >60 mL/min (ref >60)
Glucose, Bld: 103 mg/dL — ABNORMAL HIGH (ref 65–99)
Potassium: 3.9 mmol/L (ref 3.5–5.1)
SODIUM: 139 mmol/L (ref 135–145)

## 2017-04-21 MED ORDER — DIAZEPAM 5 MG PO TABS: 5.0000 mg | ORAL_TABLET | Freq: Three times a day (TID) | ORAL | 0 refills | Status: DC | PRN

## 2017-04-21 NOTE — ED Provider Notes (Signed)
[facility] Emergency Department Provider Note    None    (approximate)  I have reviewed the triage vital signs and the nursing notes.   HISTORY  Chief Complaint Chest Pain    HPI Randy Gillespie is a 26 y.o. male with midsternal chest pain and pressure that started when the patient woke up. Since been constant in nature. Patient states his been undergoing a lot of stress recently. States he's had similar chest pains in the past often what relation to anger or emotional stress. Patient states he just recently got married and his wife was not talking to him yesterday. He is not certain why this would try to talk to her throughout the day. He is concerned that she is upset with him. He is also stressed out due to not having a job and not being able to drive. No family history of early cardiac disease. He does smoke but denies any cough or shortness of breath. No SI or HI.   History reviewed. No pertinent past medical history. FMH:  History reviewed. No pertinent surgical history. There are no active problems to display for this patient.     Prior to Admission medications   Medication Sig Start Date End Date Taking? Authorizing Provider  etodolac (LODINE) 400 MG tablet Take 1 tablet (400 mg total) by mouth 2 (two) times daily. 02/13/17   Tommi RumpsSummers, Rhonda L, PA-C    Allergies Patient has no known allergies.    Social History Social History  Substance Use Topics  . Smoking status: Current Every Day Smoker    Packs/day: 1.00    Types: Cigarettes  . Smokeless tobacco: Never Used  . Alcohol use Yes     Comment: Occ    Review of Systems Patient denies headaches, rhinorrhea, blurry vision, numbness, shortness of breath, chest pain, edema, cough, abdominal pain, nausea, vomiting, diarrhea, dysuria, fevers, rashes or hallucinations unless otherwise stated above in HPI. ____________________________________________   PHYSICAL EXAM:  VITAL  SIGNS: Vitals:   04/21/17 1648  BP: 111/77  Pulse: 80  Resp: 14  Temp: 98.3 F (36.8 C)    Constitutional: Alert and oriented. Well appearing and in no acute distress. Eyes: Conjunctivae are normal.  Head: Atraumatic. Nose: No congestion/rhinnorhea. Mouth/Throat: Mucous membranes are moist.   Neck: No stridor. Painless ROM.  Cardiovascular: Normal rate, regular rhythm. Grossly normal heart sounds.  Good peripheral circulation. Respiratory: Normal respiratory effort.  No retractions. Lungs CTAB. Gastrointestinal: Soft and nontender. No distention. No abdominal bruits. No CVA tenderness. Genitourinary:  Musculoskeletal: No lower extremity tenderness nor edema.  No joint effusions. Neurologic:  Normal speech and language. No gross focal neurologic deficits are appreciated. No facial droop Skin:  Skin is warm, dry and intact. No rash noted. Psychiatric: Mood and affect are normal. Speech and behavior are normal.  ____________________________________________   LABS (all labs ordered are listed, but only abnormal results are displayed)  Results for orders placed or performed during the hospital encounter of 04/21/17 (from the past 24 hour(s))  Basic metabolic panel     Status: Abnormal   Collection Time: 04/21/17  4:52 PM  Result Value Ref Range   Sodium 139 135 - 145 mmol/L   Potassium 3.9 3.5 - 5.1 mmol/L   Chloride 103 101 - 111 mmol/L   CO2 26 22 - 32 mmol/L   Glucose, Bld 103 (H) 65 - 99 mg/dL   BUN 14 6 - 20 mg/dL   Creatinine, Ser 4.541.10 0.61 - 1.24  mg/dL   Calcium 40.910.1 8.9 - 81.110.3 mg/dL   GFR calc non Af Amer >60 >60 mL/min   GFR calc Af Amer >60 >60 mL/min   Anion gap 10 5 - 15  CBC     Status: Abnormal   Collection Time: 04/21/17  4:52 PM  Result Value Ref Range   WBC 10.0 3.8 - 10.6 K/uL   RBC 6.34 (H) 4.40 - 5.90 MIL/uL   Hemoglobin 15.2 13.0 - 18.0 g/dL   HCT 91.446.1 78.240.0 - 95.652.0 %   MCV 72.8 (L) 80.0 - 100.0 fL   MCH 23.9 (L) 26.0 - 34.0 pg   MCHC 32.9 32.0 -  36.0 g/dL   RDW 21.315.2 (H) 08.611.5 - 57.814.5 %   Platelets 276 150 - 440 K/uL  Troponin I     Status: None   Collection Time: 04/21/17  4:52 PM  Result Value Ref Range   Troponin I <0.03 <0.03 ng/mL   ____________________________________________  EKG My review and personal interpretation at Time: 16:56   Indication: chest pain  Rate: 85  Rhythm: sinus Axis: normal Other: no stemi, normal intervals, no depressions ____________________________________________  RADIOLOGY  I personally reviewed all radiographic images ordered to evaluate for the above acute complaints and reviewed radiology reports and findings.  These findings were personally discussed with the patient.  Please see medical record for radiology report.  ____________________________________________   PROCEDURES  Procedure(s) performed:  Procedures    Critical Care performed: no ____________________________________________   INITIAL IMPRESSION / ASSESSMENT AND PLAN / ED COURSE  Pertinent labs & imaging results that were available during my care of the patient were reviewed by me and considered in my medical decision making (see chart for details).  DDX: ACS, pericarditis, esophagitis, boerhaaves, pe, dissection, pna, bronchitis, costochondritis   Randy Gillespie is a 26 y.o. who presents to the ED with chest pain that seems primarily stress driven. EKG shows no evidence of acute ischemia. His troponin is negative. His heart score is 1. Patient is low risk by well's and is PERC neg.  abdominal exam soft and benign. There is no evidence of asthma exacerbation or acute bronchitis. No evidence pneumothorax or pneumonia. She'll be given referral for outpatient counseling.  Have discussed with the patient and available family all diagnostics and treatments performed thus far and all questions were answered to the best of my ability. The patient demonstrates understanding and agreement with plan.        ____________________________________________   FINAL CLINICAL IMPRESSION(S) / ED DIAGNOSES  Final diagnoses:  Chest pain, unspecified type  Stress      NEW MEDICATIONS STARTED DURING THIS VISIT:  New Prescriptions   No medications on file     Note:  This document was prepared using Dragon voice recognition software and may include unintentional dictation errors.    Willy Eddyobinson, Nazarene Bunning, MD 04/21/17 2031

## 2017-04-21 NOTE — ED Triage Notes (Signed)
C/O left sided chest pain.  Pain started today after patient woke up this morning.

## 2017-10-02 ENCOUNTER — Other Ambulatory Visit: Payer: Self-pay

## 2017-10-02 ENCOUNTER — Emergency Department: Payer: Medicaid Other

## 2017-10-02 ENCOUNTER — Emergency Department
Admission: EM | Admit: 2017-10-02 | Discharge: 2017-10-02 | Disposition: A | Discharge status: Home / Self Care | Payer: Medicaid Other | Attending: Emergency Medicine | Admitting: Emergency Medicine

## 2017-10-02 DIAGNOSIS — S6991XA Unspecified injury of right wrist, hand and finger(s), initial encounter: Secondary | ICD-10-CM | POA: Diagnosis present

## 2017-10-02 DIAGNOSIS — Y999 Unspecified external cause status: Secondary | ICD-10-CM | POA: Diagnosis not present

## 2017-10-02 DIAGNOSIS — S63501A Unspecified sprain of right wrist, initial encounter: Secondary | ICD-10-CM | POA: Insufficient documentation

## 2017-10-02 DIAGNOSIS — F17210 Nicotine dependence, cigarettes, uncomplicated: Secondary | ICD-10-CM | POA: Diagnosis not present

## 2017-10-02 DIAGNOSIS — Z79899 Other long term (current) drug therapy: Secondary | ICD-10-CM | POA: Diagnosis not present

## 2017-10-02 DIAGNOSIS — Y929 Unspecified place or not applicable: Secondary | ICD-10-CM | POA: Diagnosis not present

## 2017-10-02 DIAGNOSIS — Y33XXXA Other specified events, undetermined intent, initial encounter: Secondary | ICD-10-CM | POA: Insufficient documentation

## 2017-10-02 DIAGNOSIS — Y9371 Activity, boxing: Secondary | ICD-10-CM | POA: Insufficient documentation

## 2017-10-02 MED ORDER — NAPROXEN 500 MG PO TABS: 500.0000 mg | ORAL_TABLET | Freq: Two times a day (BID) | ORAL | 2 refills | Status: DC

## 2017-10-02 NOTE — ED Provider Notes (Signed)
[facility] Emergency Department Provider Note   ____________________________________________    I have reviewed the triage vital signs and the nursing notes.   HISTORY  Chief Complaint Wrist Pain     HPI Randy Gillespie is a 27 y.o. male who presents with complaints of right wrist pain.  Patient reports he was boxing with his brother yesterday.  Wrist started hurting this morning, pain is worse with dorsiflexing his wrist and to palpation.  No bruising or significant swelling.  Has not taken anything for it   History reviewed. No pertinent past medical history.  There are no active problems to display for this patient.   History reviewed. No pertinent surgical history.  Prior to Admission medications   Medication Sig Start Date End Date Taking? Authorizing Provider  diazepam (VALIUM) 5 MG tablet Take 1 tablet (5 mg total) by mouth every 8 (eight) hours as needed for anxiety. 04/21/17 04/21/18  Willy Eddy, MD  etodolac (LODINE) 400 MG tablet Take 1 tablet (400 mg total) by mouth 2 (two) times daily. 02/13/17   Tommi Rumps, PA-C  naproxen (NAPROSYN) 500 MG tablet Take 1 tablet (500 mg total) by mouth 2 (two) times daily with a meal. 10/02/17   Jene Every, MD     Allergies Patient has no known allergies.  History reviewed. No pertinent family history.  Social History Social History   Tobacco Use  . Smoking status: Current Every Day Smoker    Packs/day: 1.00    Types: Cigarettes  . Smokeless tobacco: Never Used  Substance Use Topics  . Alcohol use: Yes    Comment: Occ  . Drug use: No    Review of Systems  Constitutional: No fever/chills  ENT: No sore throat.   Gastrointestinal: No abdominal pain.  No nausea, no vomiting.   Genitourinary: Negative for dysuria. Musculoskeletal: Negative for back pain. Skin: Negative for rash. Neurological: Negative for headaches      ____________________________________________   PHYSICAL EXAM:  VITAL SIGNS: ED Triage Vitals  Enc Vitals Group     BP 10/02/17 1857 139/80     Pulse Rate 10/02/17 1855 93     Resp 10/02/17 1855 18     Temp 10/02/17 1855 98.6 F (37 C)     Temp Source 10/02/17 1855 Oral     SpO2 10/02/17 1855 98 %     Weight --      Height 10/02/17 1856 1.829 m (6')     Head Circumference --      Peak Flow --      Pain Score 10/02/17 1855 7     Pain Loc --      Pain Edu? --      Excl. in GC? --      Constitutional: Alert and oriented. No acute distress. Pleasant and interactive    Cardiovascular: Normal rate, regular rhythm.  Respiratory: Normal respiratory effort.  No retractions. Genitourinary: deferred Musculoskeletal: Mild right wrist tenderness to palpation dorsally, no pain with dorsiflexion, no bony a normalities or significant swelling or redness Neurologic:  Normal speech and language. No gross focal neurologic deficits are appreciated.   Skin:  Skin is warm, dry and intact. No rash noted.   ____________________________________________   LABS (all labs ordered are listed, but only abnormal results are displayed)  Labs Reviewed - No data to display ____________________________________________  EKG   ____________________________________________  RADIOLOGY  X-rays negative for fracture ____________________________________________   PROCEDURES  Procedure(s) performed: No  Procedures  Critical Care performed: No ____________________________________________   INITIAL IMPRESSION / ASSESSMENT AND PLAN / ED COURSE  Pertinent labs & imaging results that were available during my care of the patient were reviewed by me and considered in my medical decision making (see chart for details).  Consistent with wrist sprain, premade splint given/Ace wrap as needed.  Recommend ice, NSAIDs   ____________________________________________   FINAL CLINICAL  IMPRESSION(S) / ED DIAGNOSES  Final diagnoses:  Sprain of right wrist, initial encounter      NEW MEDICATIONS STARTED DURING THIS VISIT:  This SmartLink is deprecated. Use AVSMEDLIST instead to display the medication list for a patient.   Note:  This document was prepared using Dragon voice recognition software and may include unintentional dictation errors.    Jene EveryKinner, Avonte Sensabaugh, MD 10/02/17 2040

## 2017-10-02 NOTE — ED Triage Notes (Signed)
Pt states he was boxing last night and started having R wrist pain and R hand pain. Appears swollen. States pain increased d/t going to work today and using hand. Able to wiggle fingers. Hx of break in R wrist.

## 2017-10-02 NOTE — ED Notes (Signed)
Pt signed esignature.  D/c  inst to pt.  

## 2017-12-30 ENCOUNTER — Encounter: Payer: Self-pay | Admitting: Emergency Medicine

## 2017-12-30 DIAGNOSIS — R51 Headache: Secondary | ICD-10-CM | POA: Insufficient documentation

## 2017-12-30 DIAGNOSIS — Z5321 Procedure and treatment not carried out due to patient leaving prior to being seen by health care provider: Secondary | ICD-10-CM | POA: Insufficient documentation

## 2017-12-30 NOTE — ED Triage Notes (Signed)
Pt states he has headache, has not taken tylenol or ibuprofen at home.  Pt denies any sensoy changes.  Pt states pain is 6/10.

## 2017-12-31 ENCOUNTER — Emergency Department
Admission: EM | Admit: 2017-12-31 | Discharge: 2017-12-31 | Disposition: A | Discharge status: Home / Self Care | Payer: Medicaid Other | Attending: Emergency Medicine | Admitting: Emergency Medicine

## 2018-01-12 ENCOUNTER — Emergency Department: Payer: Medicaid Other

## 2018-01-12 ENCOUNTER — Emergency Department
Admission: EM | Admit: 2018-01-12 | Discharge: 2018-01-12 | Disposition: A | Discharge status: Home / Self Care | Payer: Medicaid Other | Attending: Emergency Medicine | Admitting: Emergency Medicine

## 2018-01-12 ENCOUNTER — Other Ambulatory Visit: Payer: Self-pay

## 2018-01-12 DIAGNOSIS — R0789 Other chest pain: Secondary | ICD-10-CM | POA: Insufficient documentation

## 2018-01-12 DIAGNOSIS — F17210 Nicotine dependence, cigarettes, uncomplicated: Secondary | ICD-10-CM | POA: Diagnosis not present

## 2018-01-12 DIAGNOSIS — Z79899 Other long term (current) drug therapy: Secondary | ICD-10-CM | POA: Diagnosis not present

## 2018-01-12 DIAGNOSIS — R079 Chest pain, unspecified: Secondary | ICD-10-CM | POA: Diagnosis present

## 2018-01-12 LAB — CBC
HCT: 42.5 % (ref 40.0–52.0)
HEMOGLOBIN: 13.8 g/dL (ref 13.0–18.0)
MCH: 23.8 pg — AB (ref 26.0–34.0)
MCHC: 32.4 g/dL (ref 32.0–36.0)
MCV: 73.5 fL — ABNORMAL LOW (ref 80.0–100.0)
PLATELETS: 264 10*3/uL (ref 150–440)
RBC: 5.78 MIL/uL (ref 4.40–5.90)
RDW: 14.1 % (ref 11.5–14.5)
WBC: 7.7 10*3/uL (ref 3.8–10.6)

## 2018-01-12 LAB — BASIC METABOLIC PANEL
ANION GAP: 5 (ref 5–15)
BUN: 21 mg/dL — ABNORMAL HIGH (ref 6–20)
CALCIUM: 9.2 mg/dL (ref 8.9–10.3)
CO2: 28 mmol/L (ref 22–32)
CREATININE: 1.19 mg/dL (ref 0.61–1.24)
Chloride: 106 mmol/L (ref 101–111)
Glucose, Bld: 102 mg/dL — ABNORMAL HIGH (ref 65–99)
Potassium: 3.8 mmol/L (ref 3.5–5.1)
Sodium: 139 mmol/L (ref 135–145)

## 2018-01-12 LAB — TROPONIN I

## 2018-01-12 MED ORDER — IBUPROFEN 600 MG PO TABS: 600.0000 mg | ORAL_TABLET | Freq: Four times a day (QID) | ORAL | 0 refills | Status: DC | PRN

## 2018-01-12 MED ORDER — DIAZEPAM 5 MG PO TABS: 5.0000 mg | ORAL_TABLET | Freq: Three times a day (TID) | ORAL | 0 refills | Status: DC | PRN

## 2018-01-12 NOTE — Discharge Instructions (Signed)
Return to the ER for new, worsening, or persistent severe chest pain, difficulty breathing, weakness or lightheadedness, or any other new or worsening symptoms that concern you.  You may take the prescribed ibuprofen for pain, as well as the Valium as a muscle relaxant.  Do not take the Valium when driving or operating machinery.

## 2018-01-12 NOTE — ED Triage Notes (Signed)
Pt reports right sided chest pain that started while he was working, pt states no injury, denies cough or congestion, pt reports chest pain intermittently since he was young without diagnosis

## 2018-01-12 NOTE — ED Provider Notes (Signed)
[facility] Emergency Department Provider Note ____________________________________________   First MD Initiated Contact with Patient 01/12/18 1319     (approximate)  I have reviewed the triage vital signs and the nursing notes.   HISTORY  Chief Complaint Chest Pain    HPI Randy Gillespie is a 27 y.o. male with no significant PMH who presents with chest pain, acute onset this morning, mainly right-sided and around his pectoral muscle, and not associated with nausea, vomiting, or shortness of breath.  He does report some lightheadedness which has resolved.  He reports an extensive history of similar pain in the past, for which he has had negative work-ups.  He denies leg pain or swelling.   No past medical history on file.  There are no active problems to display for this patient.   No past surgical history on file.  Prior to Admission medications   Medication Sig Start Date End Date Taking? Authorizing Provider  diazepam (VALIUM) 5 MG tablet Take 1 tablet (5 mg total) by mouth every 8 (eight) hours as needed for anxiety or muscle spasms. 01/12/18 02/11/18  Dionne Bucy, MD  etodolac (LODINE) 400 MG tablet Take 1 tablet (400 mg total) by mouth 2 (two) times daily. 02/13/17   Tommi Rumps, PA-C  ibuprofen (ADVIL,MOTRIN) 600 MG tablet Take 1 tablet (600 mg total) by mouth every 6 (six) hours as needed. 01/12/18   Dionne Bucy, MD  naproxen (NAPROSYN) 500 MG tablet Take 1 tablet (500 mg total) by mouth 2 (two) times daily with a meal. 10/02/17   Jene Every, MD    Allergies Patient has no known allergies.  No family history on file.  Social History Social History   Tobacco Use  . Smoking status: Current Every Day Smoker    Packs/day: 1.00    Types: Cigarettes  . Smokeless tobacco: Never Used  Substance Use Topics  . Alcohol use: Yes    Comment: Occ  . Drug use: No    Types: Marijuana    Review of Systems  Constitutional:  No fever. Eyes: No redness. ENT: No neck pain. Cardiovascular: Positive for chest pain. Respiratory: Denies shortness of breath. Gastrointestinal: No vomiting.  Genitourinary: Negative for flank pain.  Musculoskeletal: Negative for back pain. Skin: Negative for rash. Neurological: Negative for headache.   ____________________________________________   PHYSICAL EXAM:  VITAL SIGNS: ED Triage Vitals  Enc Vitals Group     BP 01/12/18 1115 122/61     Pulse Rate 01/12/18 1115 88     Resp 01/12/18 1115 18     Temp 01/12/18 1115 98.4 F (36.9 C)     Temp Source 01/12/18 1115 Oral     SpO2 01/12/18 1115 97 %     Weight 01/12/18 1116 255 lb (115.7 kg)     Height 01/12/18 1116 6' (1.829 m)     Head Circumference --      Peak Flow --      Pain Score 01/12/18 1115 6     Pain Loc --      Pain Edu? --      Excl. in GC? --     Constitutional: Alert and oriented. Well appearing and in no acute distress. Eyes: Conjunctivae are normal.  Head: Atraumatic. Nose: No congestion/rhinnorhea. Mouth/Throat: Mucous membranes are moist.   Neck: Normal range of motion.  Cardiovascular: Normal rate, regular rhythm. Grossly normal heart sounds.  Good peripheral circulation.  Chest wall nontender. Respiratory: Normal respiratory effort.  No retractions. Lungs  CTAB. Gastrointestinal: No distention.  Musculoskeletal: No lower extremity edema.  Extremities warm and well perfused.  No calf or popliteal swelling or tenderness. Neurologic:  Normal speech and language. No gross focal neurologic deficits are appreciated.  Skin:  Skin is warm and dry. No rash noted. Psychiatric: Mood and affect are normal. Speech and behavior are normal.  ____________________________________________   LABS (all labs ordered are listed, but only abnormal results are displayed)  Labs Reviewed  BASIC METABOLIC PANEL - Abnormal; Notable for the following components:      Result Value   Glucose, Bld 102 (*)    BUN 21  (*)    All other components within normal limits  CBC - Abnormal; Notable for the following components:   MCV 73.5 (*)    MCH 23.8 (*)    All other components within normal limits  TROPONIN I   ____________________________________________  EKG  ED ECG REPORT I, Dionne BucySebastian Vivia Rosenburg, the attending physician, personally viewed and interpreted this ECG.  Date: 01/12/2018 EKG Time: 1106 Rate: 86 Rhythm: normal sinus rhythm QRS Axis: normal Intervals: normal ST/T Wave abnormalities: normal Narrative Interpretation: no evidence of acute ischemia  ____________________________________________  RADIOLOGY  CXR: No focal infiltrate or other acute findings  ____________________________________________   PROCEDURES  Procedure(s) performed: No  Procedures  Critical Care performed: No ____________________________________________   INITIAL IMPRESSION / ASSESSMENT AND PLAN / ED COURSE  Pertinent labs & imaging results that were available during my care of the patient were reviewed by me and considered in my medical decision making (see chart for details).  27 year old male with PMH as noted above including multiple prior episodes of chest pain for which she has had negative work-ups presents with chest pain this morning while he was at work, associated with lightheadedness but no other significant symptoms.  Is now mostly resolved.  It feels like the pain he has had in the past.  On exam, the patient is well-appearing, vitals are normal, and the remainder the exam is unremarkable.  EKG is nonischemic, his chest x-ray is negative, and first troponin is also negative.  There is no clinical evidence for ACS or indication for further observation or repeat enzymes given his overall very low risk.  Patient is also PERC negative.  The patient reports that the medication he was given the last time he had this was helpful.  I reviewed the past medical records in epic and confirmed that the  patient was last here for this in 2018, was thought to have anxiety, and was given Valium.  Patient does not appear particularly anxious right now, but this is certainly on the differential.  I am also considering muscular or chest wall pain given the location.  I will prescribe NSAIDs, as well as a small quantity of a low dose of Valium primarily for the muscle relaxant effect.  Return precautions given, and the patient expresses understanding.  I will provide referral for primary care, as patient states he does not have a primary care provider.   ____________________________________________   FINAL CLINICAL IMPRESSION(S) / ED DIAGNOSES  Final diagnoses:  Atypical chest pain      NEW MEDICATIONS STARTED DURING THIS VISIT:  New Prescriptions   DIAZEPAM (VALIUM) 5 MG TABLET    Take 1 tablet (5 mg total) by mouth every 8 (eight) hours as needed for anxiety or muscle spasms.   IBUPROFEN (ADVIL,MOTRIN) 600 MG TABLET    Take 1 tablet (600 mg total) by mouth every 6 (six) hours  as needed.     Note:  This document was prepared using Dragon voice recognition software and may include unintentional dictation errors.     Dionne Bucy, MD 01/12/18 1410

## 2018-01-30 ENCOUNTER — Emergency Department
Admission: EM | Admit: 2018-01-30 | Discharge: 2018-01-30 | Disposition: A | Discharge status: Home / Self Care | Payer: Medicaid Other | Attending: Emergency Medicine | Admitting: Emergency Medicine

## 2018-01-30 ENCOUNTER — Other Ambulatory Visit: Payer: Self-pay

## 2018-01-30 ENCOUNTER — Emergency Department: Payer: Medicaid Other

## 2018-01-30 DIAGNOSIS — S8392XA Sprain of unspecified site of left knee, initial encounter: Secondary | ICD-10-CM | POA: Insufficient documentation

## 2018-01-30 DIAGNOSIS — Y999 Unspecified external cause status: Secondary | ICD-10-CM | POA: Insufficient documentation

## 2018-01-30 DIAGNOSIS — X509XXA Other and unspecified overexertion or strenuous movements or postures, initial encounter: Secondary | ICD-10-CM | POA: Diagnosis not present

## 2018-01-30 DIAGNOSIS — F17210 Nicotine dependence, cigarettes, uncomplicated: Secondary | ICD-10-CM | POA: Insufficient documentation

## 2018-01-30 DIAGNOSIS — Y9333 Activity, BASE jumping: Secondary | ICD-10-CM | POA: Diagnosis not present

## 2018-01-30 DIAGNOSIS — Y9289 Other specified places as the place of occurrence of the external cause: Secondary | ICD-10-CM | POA: Diagnosis not present

## 2018-01-30 DIAGNOSIS — S8992XA Unspecified injury of left lower leg, initial encounter: Secondary | ICD-10-CM | POA: Diagnosis present

## 2018-01-30 MED ORDER — IBUPROFEN 800 MG PO TABS: 800.0000 mg | ORAL_TABLET | Freq: Three times a day (TID) | ORAL | 0 refills | Status: DC

## 2018-01-30 NOTE — Discharge Instructions (Addendum)
Follow-up with Dr. Ernest Pine if any continued problems with your knee in 5 to 7 days.  Wear your knee immobilizer that you have at home a time that you are walking.  Ice and elevation when sitting.  Begin taking ibuprofen 800 mg 3 times daily with food.

## 2018-01-30 NOTE — ED Triage Notes (Signed)
[place], jumping around a lot and is having left knee pain today,. States it is hard to bend it.Marland Kitchen

## 2018-01-30 NOTE — ED Notes (Signed)
See triage note  Presents with left knee pain  States he did not fall on to knee but did a lot of jumping   May have twisted the knee

## 2018-01-30 NOTE — ED Provider Notes (Signed)
[facility] Emergency Department Provider Note  ____________________________________________   First MD Initiated Contact with Patient 01/30/18 [phone]     (approximate)  I have reviewed the triage vital signs and the nursing notes.   HISTORY  Chief Complaint Knee Pain   HPI Randy Gillespie is a 27 y.o. male of left knee pain.  Patient states that he was at his son's birthday party last evening was jumping around a lot.  He denies any known injury to his knee.  He states that today is hard to bend his knee due to pain.  He denies any previous problems with his knee.  Patient has not taken any over-the-counter medication for this.  Currently rates his pain as 7 out of 10.  History reviewed. No pertinent past medical history.  There are no active problems to display for this patient.   No past surgical history on file.  Prior to Admission medications   Medication Sig Start Date End Date Taking? Authorizing Provider  ibuprofen (ADVIL,MOTRIN) 800 MG tablet Take 1 tablet (800 mg total) by mouth 3 (three) times daily. 01/30/18   Tommi Rumps, PA-C    Allergies Patient has no known allergies.  No family history on file.  Social History Social History   Tobacco Use  . Smoking status: Current Every Day Smoker    Packs/day: 1.00    Types: Cigarettes  . Smokeless tobacco: Never Used  Substance Use Topics  . Alcohol use: Yes    Comment: Occ  . Drug use: No    Types: Marijuana    Review of Systems Constitutional: No fever/chills Cardiovascular: Denies chest pain. Respiratory: Denies shortness of breath. Gastrointestinal:  No nausea, no vomiting.  Musculoskeletal: Positive for left knee pain. Skin: Negative for rash. Neurological: Negative for headaches, focal weakness or numbness. ___________________________________________   PHYSICAL EXAM:  VITAL SIGNS: ED Triage Vitals  Enc Vitals Group     BP 01/30/18 0750 (!) 115/50     Pulse Rate  01/30/18 0748 78     Resp 01/30/18 0748 16     Temp 01/30/18 0748 98.1 F (36.7 C)     Temp Source 01/30/18 0748 Oral     SpO2 01/30/18 0748 98 %     Weight 01/30/18 0749 255 lb (115.7 kg)     Height 01/30/18 0749 6' (1.829 m)     Head Circumference --      Peak Flow --      Pain Score 01/30/18 0749 7     Pain Loc --      Pain Edu? --      Excl. in GC? --    Constitutional: Alert and oriented. Well appearing and in no acute distress. Eyes: Conjunctivae are normal. Head: Atraumatic. Neck: No stridor.   Cardiovascular: Normal rate, regular rhythm. Grossly normal heart sounds.  Good peripheral circulation. Respiratory: Normal respiratory effort.  No retractions. Lungs CTAB. Musculoskeletal: On examination of the left knee there is no gross deformity and no obvious effusion present.  There is tenderness on palpation of the medial aspect without soft tissue swelling.  Ligaments are stable bilaterally.  No ecchymosis or abrasions were seen.  Range of motion is slightly restricted secondary to patient's discomfort. Neurologic:  Normal speech and language. No gross focal neurologic deficits are appreciated.  Skin:  Skin is warm, dry and intact.  No ecchymosis, erythema or abrasions were seen. Psychiatric: Mood and affect are normal. Speech and behavior are normal.  ____________________________________________  LABS (all labs ordered are listed, but only abnormal results are displayed)  Labs Reviewed - No data to display  RADIOLOGY  ED MD interpretation:  Left knee x-ray is negative for fracture or dislocation.  Official radiology report(s): Dg Knee Complete 4 Views Left  Result Date: 01/30/2018 CLINICAL DATA:  Left knee pain after over exertion. Difficulty bending the knee. EXAM: LEFT KNEE - COMPLETE 4+ VIEW COMPARISON:  None. FINDINGS: The bones are subjectively adequately mineralized. The joint spaces are well maintained. There is no acute fracture or dislocation. There is no  significant osteophyte formation. There is no joint effusion. IMPRESSION: There is no acute or significant chronic bony abnormality of the left knee. Electronically Signed   By: David  [place] M.D.   On: 01/30/2018 08:46    ____________________________________________   PROCEDURES  Procedure(s) performed: None  Procedures  Critical Care performed: No  ____________________________________________   INITIAL IMPRESSION / ASSESSMENT AND PLAN / ED COURSE Patient is here with complaint of left knee pain since jumping last evening at his son's birthday party.  X-rays are reassuring that there was no bony injury.  Patient currently has a knee immobilizer at home and will wear this.  He was encouraged to ice and elevate as needed for pain or swelling.  He was given a prescription for ibuprofen 800 mg 3 times daily with food.  He is to follow-up with Dr. Ernest Pine if any continued problems with his knee.  ____________________________________________   FINAL CLINICAL IMPRESSION(S) / ED DIAGNOSES  Final diagnoses:  Sprain of left knee, unspecified ligament, initial encounter     ED Discharge Orders        Ordered    ibuprofen (ADVIL,MOTRIN) 800 MG tablet  3 times daily     01/30/18 0915       Note:  This document was prepared using Dragon voice recognition software and may include unintentional dictation errors.    Tommi Rumps, PA-C 01/30/18 1047    Jeanmarie Plant, MD 01/30/18 [phone]

## 2018-09-03 IMAGING — DX DG KNEE COMPLETE 4+V*L*
4 series · 4 of 4 positions shown · non-contrast
Comparison: None.

CLINICAL DATA: Left knee pain after over exertion. Difficulty
bending the knee.

EXAM:
LEFT KNEE - COMPLETE 4+ VIEW

[knee ap]
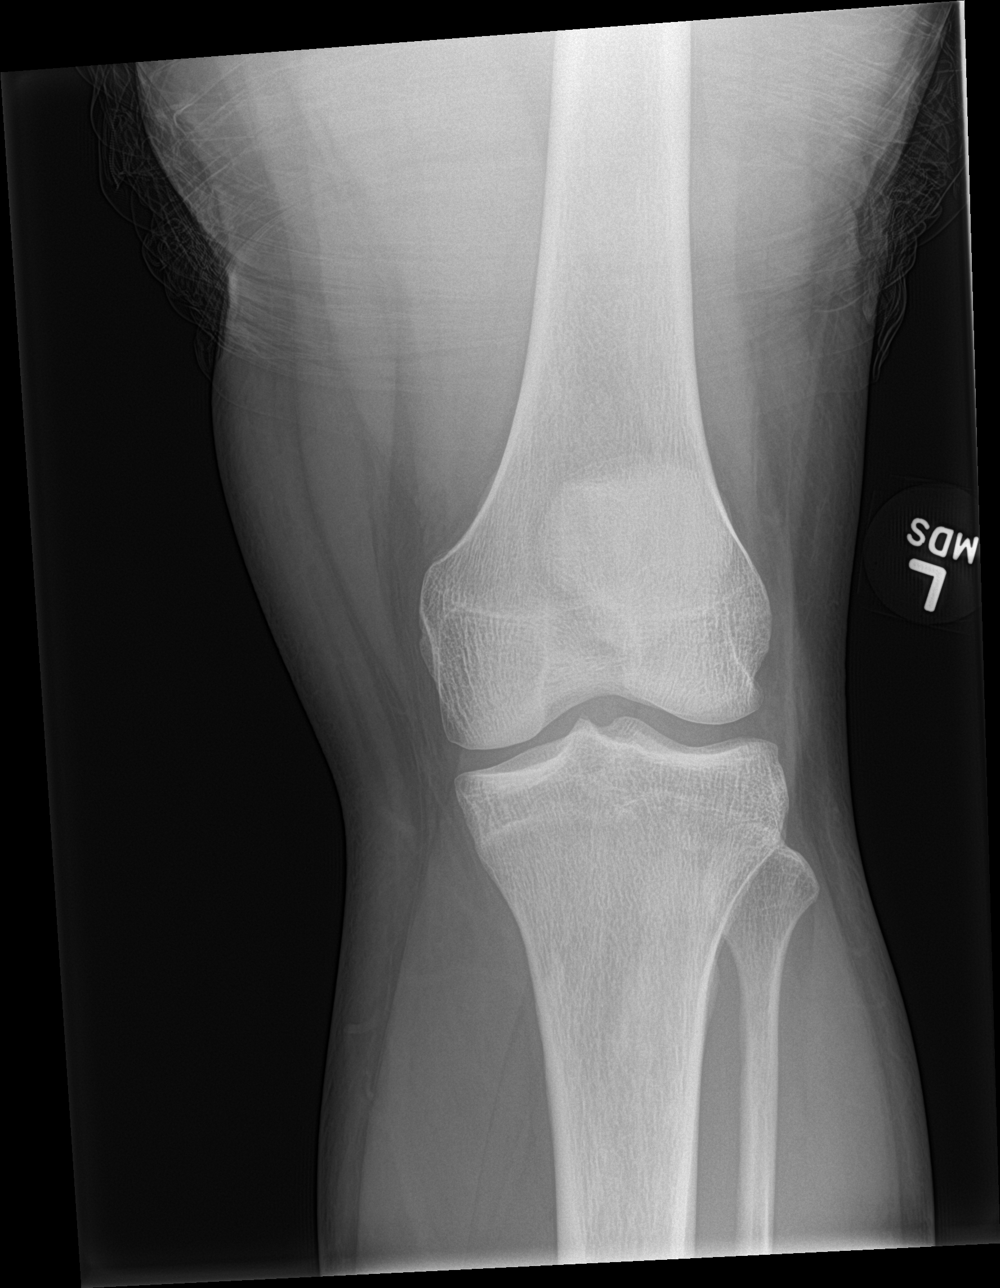

[knee tunnel]
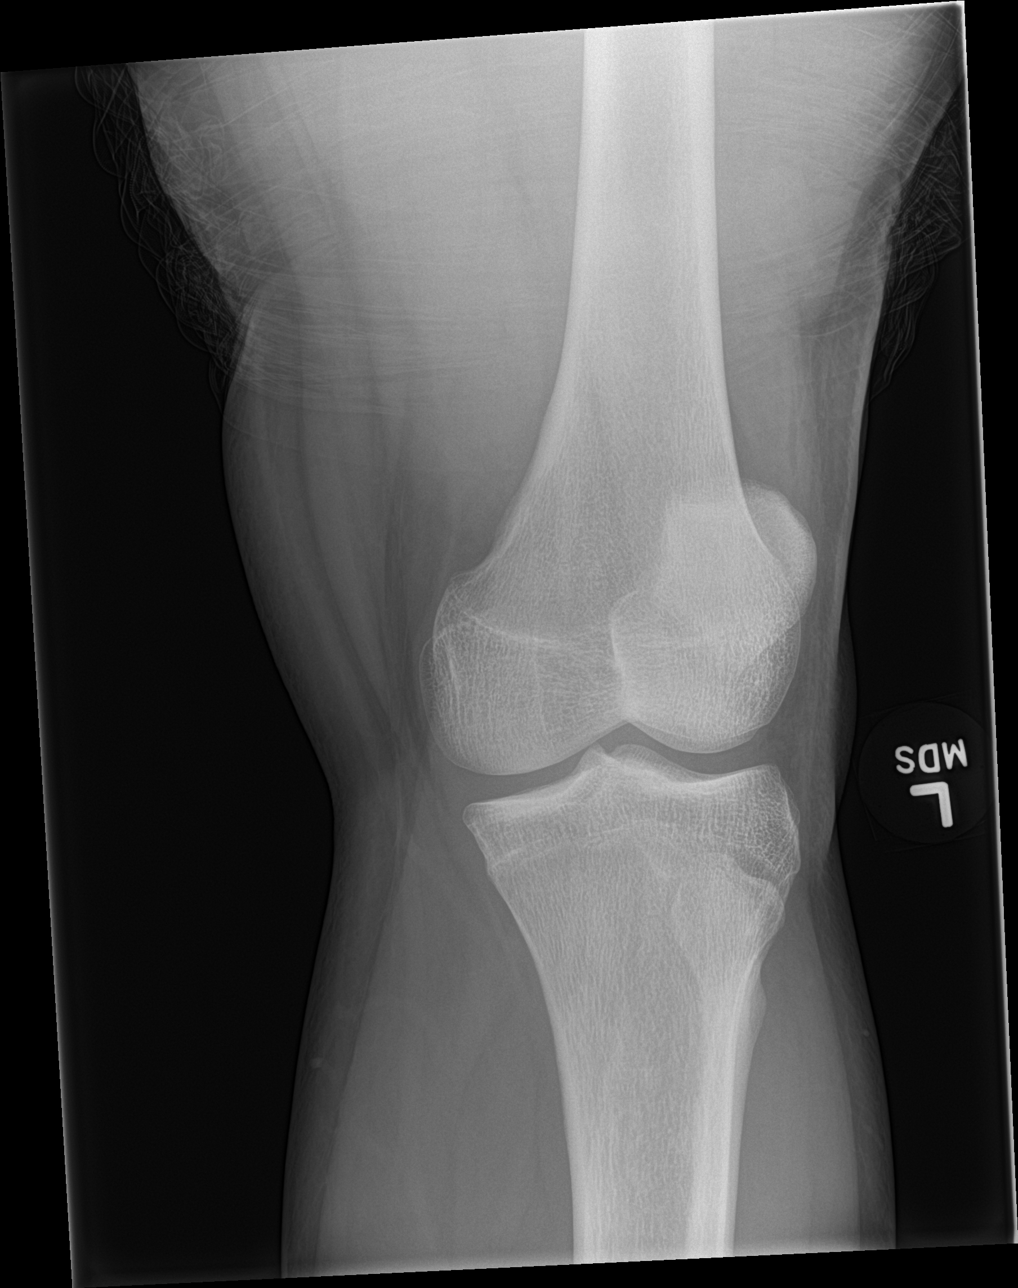

[knee lat]
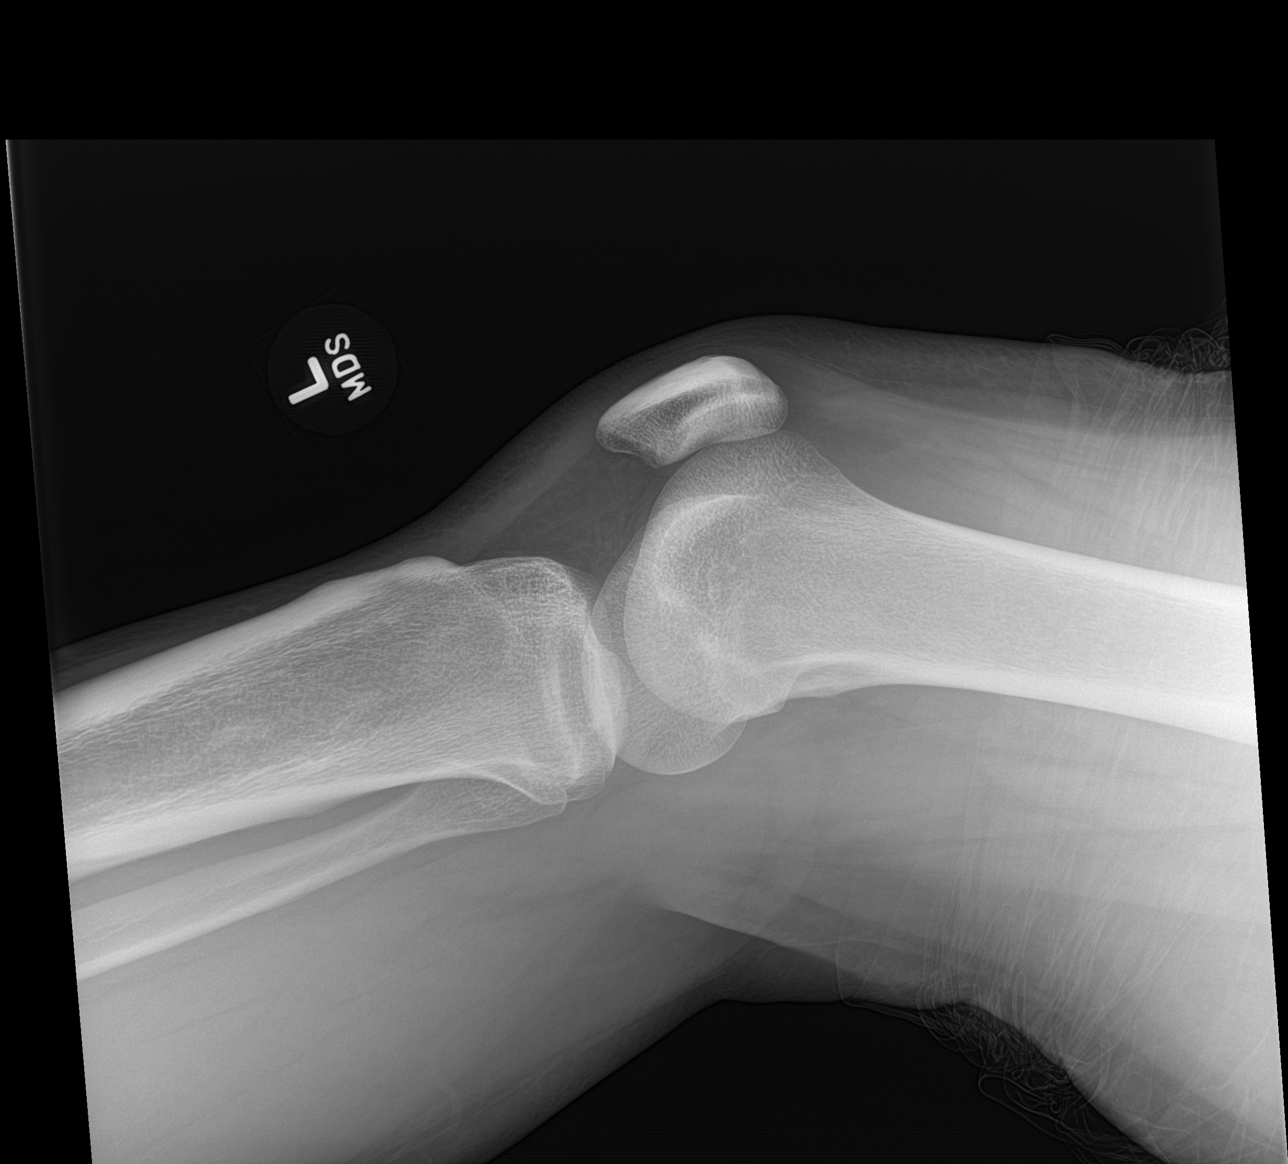

[knee obl]
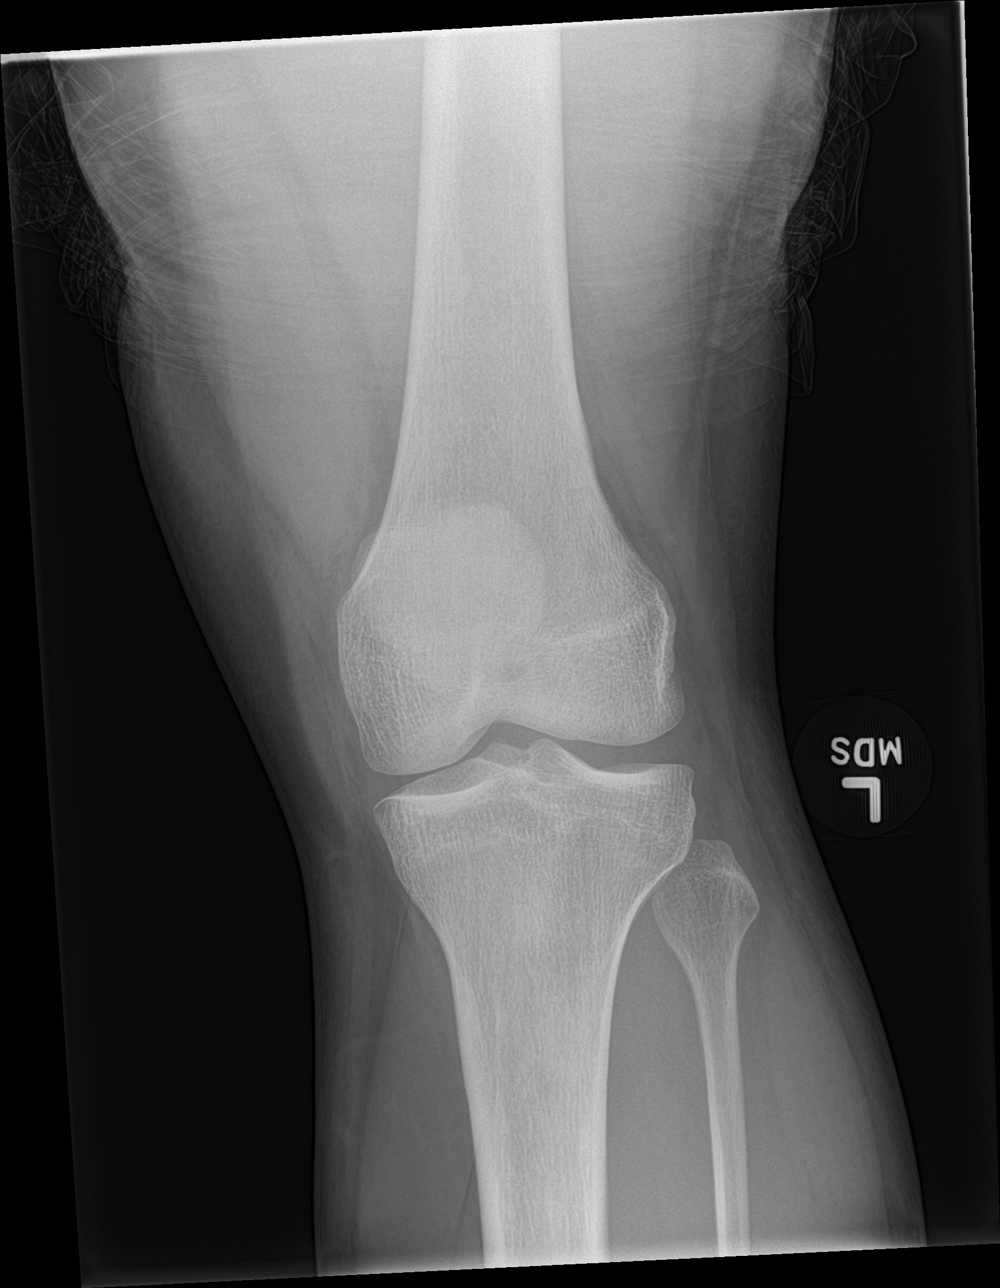

[4 of 4 positions shown; findings below may reference images not displayed]

FINDINGS: The bones are subjectively adequately mineralized. The joint spaces
are well maintained. There is no acute fracture or dislocation.
There is no significant osteophyte formation. There is no joint
effusion.
IMPRESSION: There is no acute or significant chronic bony abnormality of the
left knee.

## 2019-05-28 ENCOUNTER — Emergency Department: Payer: Medicaid Other

## 2019-05-28 ENCOUNTER — Other Ambulatory Visit: Payer: Self-pay

## 2019-05-28 ENCOUNTER — Encounter: Payer: Self-pay | Admitting: Emergency Medicine

## 2019-05-28 DIAGNOSIS — Z79899 Other long term (current) drug therapy: Secondary | ICD-10-CM | POA: Insufficient documentation

## 2019-05-28 DIAGNOSIS — R51 Headache: Secondary | ICD-10-CM | POA: Insufficient documentation

## 2019-05-28 DIAGNOSIS — R55 Syncope and collapse: Secondary | ICD-10-CM | POA: Insufficient documentation

## 2019-05-28 DIAGNOSIS — F17210 Nicotine dependence, cigarettes, uncomplicated: Secondary | ICD-10-CM | POA: Insufficient documentation

## 2019-05-28 LAB — CBC WITH DIFFERENTIAL/PLATELET
Abs Immature Granulocytes: 0.02 10*3/uL (ref 0.00–0.07)
Basophils Absolute: 0 10*3/uL (ref 0.0–0.1)
Basophils Relative: 0 %
Eosinophils Absolute: 0 10*3/uL (ref 0.0–0.5)
Eosinophils Relative: 0 %
HCT: 43.2 % (ref 39.0–52.0)
Hemoglobin: 14 g/dL (ref 13.0–17.0)
Immature Granulocytes: 0 %
Lymphocytes Relative: 37 %
Lymphs Abs: 3.5 10*3/uL (ref 0.7–4.0)
MCH: 24.3 pg — ABNORMAL LOW (ref 26.0–34.0)
MCHC: 32.4 g/dL (ref 30.0–36.0)
MCV: 74.9 fL — ABNORMAL LOW (ref 80.0–100.0)
Monocytes Absolute: 0.4 10*3/uL (ref 0.1–1.0)
Monocytes Relative: 5 %
Neutro Abs: 5.5 10*3/uL (ref 1.7–7.7)
Neutrophils Relative %: 58 %
Platelets: 313 10*3/uL (ref 150–400)
RBC: 5.77 MIL/uL (ref 4.22–5.81)
RDW: 14.8 % (ref 11.5–15.5)
WBC: 9.5 10*3/uL (ref 4.0–10.5)
nRBC: 0 % (ref 0.0–0.2)

## 2019-05-28 LAB — COMPREHENSIVE METABOLIC PANEL
ALT: 17 U/L (ref 0–44)
AST: 22 U/L (ref 15–41)
Albumin: 4.6 g/dL (ref 3.5–5.0)
Alkaline Phosphatase: 74 U/L (ref 38–126)
Anion gap: 10 (ref 5–15)
BUN: 17 mg/dL (ref 6–20)
CO2: 27 mmol/L (ref 22–32)
Calcium: 9.5 mg/dL (ref 8.9–10.3)
Chloride: 102 mmol/L (ref 98–111)
Creatinine, Ser: 1.03 mg/dL (ref 0.61–1.24)
GFR calc Af Amer: >60 mL/min (ref >60)
GFR calc non Af Amer: >60 mL/min (ref >60)
Glucose, Bld: 105 mg/dL — ABNORMAL HIGH (ref 70–99)
Potassium: 3.3 mmol/L — ABNORMAL LOW (ref 3.5–5.1)
Sodium: 139 mmol/L (ref 135–145)
Total Bilirubin: 0.9 mg/dL (ref 0.3–1.2)
Total Protein: 8.3 g/dL — ABNORMAL HIGH (ref 6.5–8.1)

## 2019-05-28 LAB — TROPONIN I (HIGH SENSITIVITY): Troponin I (High Sensitivity): 4 ng/L (ref <18)

## 2019-05-28 NOTE — ED Notes (Signed)
Lab results and scans reviewed.

## 2019-05-28 NOTE — ED Triage Notes (Signed)
Patient ambulatory to triage with steady gait, without difficulty or distress noted; pt reports rt sided HA since last night; st syncopal episode & fell last Monday in [place] and again last night while standing outside behind car; st hx HA; denies any recent illness; denies any accomp symptoms at present

## 2019-05-29 ENCOUNTER — Emergency Department
Admission: EM | Admit: 2019-05-29 | Discharge: 2019-05-29 | Discharge status: Against Medical Advice | Payer: Medicaid Other | Attending: Emergency Medicine | Admitting: Emergency Medicine

## 2019-05-29 DIAGNOSIS — R55 Syncope and collapse: Secondary | ICD-10-CM

## 2019-05-29 DIAGNOSIS — R519 Headache, unspecified: Secondary | ICD-10-CM

## 2019-05-29 NOTE — Discharge Instructions (Signed)
You are electing to leave.  We offered CTA versus lumbar puncture to rule out subarachnoid hemorrhage; bleeding in the brain.  You have declined this at this time.  We given you neurologist number to follow-up if you continue to have headaches.  Do not take ibuprofen, NSAIDs or BC powder.   For your syncope we discussed possibly needing a echocardiogram and Holter monitor.  You should follow-up with cardiology for these.  Return to ER if you decide to have further work-up or especially if you have another episode of syncope.

## 2019-05-29 NOTE — ED Provider Notes (Signed)
[facility] Emergency Department Provider Note  ____________________________________________   First MD Initiated Contact with Patient 05/29/19 0032     (approximate)  I have reviewed the triage vital signs and the nursing notes.   HISTORY  Chief Complaint Loss of Consciousness    HPI Randy Gillespie is a 28 y.o. male who is otherwise healthy presents with syncope.  Patient presents with two episodes of syncope.  The first episode of syncope happened a week ago where he was outside.  He said he felt hot and lightheaded and all of a sudden collapsed.  He then had another episode 2 days ago on Monday where he developed a severe headache and then about an hour later he was in the bathroom trying to take Tylenol and he suddenly collapsed again.  He endorses having headaches and migraines frequently however this headache was more severe than normal, came on all of a sudden, nothing made it better, nothing made it worse.  His headache did go away but then came back this morning which is why he presented to the emergency room.  He denies fevers, history of anyone dying really early or having early heart problems.    History reviewed. No pertinent past medical history.  There are no active problems to display for this patient.   History reviewed. No pertinent surgical history.  Prior to Admission medications   Medication Sig Start Date End Date Taking? Authorizing Provider  ibuprofen (ADVIL,MOTRIN) 800 MG tablet Take 1 tablet (800 mg total) by mouth 3 (three) times daily. 01/30/18   Tommi Rumps, PA-C    Allergies Patient has no known allergies.  No family history on file.  Social History Social History   Tobacco Use   Smoking status: Current Every Day Smoker    Packs/day: 1.00    Types: Cigarettes   Smokeless tobacco: Never Used  Substance Use Topics   Alcohol use: Yes    Comment: Occ   Drug use: No    Types: Marijuana      Review of  Systems Constitutional: No fever/chills Eyes: No visual changes. ENT: No sore throat. Cardiovascular: Denies chest pain. Respiratory: Denies shortness of breath. Gastrointestinal: No abdominal pain.  No nausea, no vomiting.  No diarrhea.  No constipation. Genitourinary: Negative for dysuria. Musculoskeletal: Negative for back pain. Skin: Negative for rash. Neurological: Positive for headache and syncope All other ROS negative ____________________________________________   PHYSICAL EXAM:  VITAL SIGNS: ED Triage Vitals  Enc Vitals Group     BP 05/28/19 2051 (!) 132/106     Pulse Rate 05/28/19 2051 84     Resp 05/28/19 2051 20     Temp 05/28/19 2051 98.5 F (36.9 C)     Temp Source 05/28/19 2051 Oral     SpO2 05/28/19 2051 100 %     Weight 05/28/19 2051 260 lb (117.9 kg)     Height 05/28/19 2051 6' (1.829 m)     Head Circumference --      Peak Flow --      Pain Score 05/28/19 2050 6     Pain Loc --      Pain Edu? --      Excl. in GC? --     Constitutional: Alert and oriented. Well appearing and in no acute distress. Eyes: Conjunctivae are normal. EOMI. Head: Atraumatic. Nose: No congestion/rhinnorhea. Mouth/Throat: Mucous membranes are moist.   Neck: No stridor. Trachea Midline. FROM Cardiovascular: Normal rate, regular rhythm. Grossly normal heart sounds.  Good peripheral circulation. Respiratory: Normal respiratory effort.  No retractions. Lungs CTAB. Gastrointestinal: Soft and nontender. No distention. No abdominal bruits.  Musculoskeletal: No lower extremity tenderness nor edema.  No joint effusions. Neurologic: Cranial nerves II through XII are intact. Skin:  Skin is warm, dry and intact. No rash noted. Psychiatric: Mood and affect are normal. Speech and behavior are normal. GU: Deferred   ____________________________________________   LABS (all labs ordered are listed, but only abnormal results are displayed)  Labs Reviewed  CBC WITH DIFFERENTIAL/PLATELET  - Abnormal; Notable for the following components:      Result Value   MCV 74.9 (*)    MCH 24.3 (*)    All other components within normal limits  COMPREHENSIVE METABOLIC PANEL - Abnormal; Notable for the following components:   Potassium 3.3 (*)    Glucose, Bld 105 (*)    Total Protein 8.3 (*)    All other components within normal limits  URINALYSIS, COMPLETE (UACMP) WITH MICROSCOPIC  URINE DRUG SCREEN, QUALITATIVE ([facility] ONLY)  TROPONIN I (HIGH SENSITIVITY)   ____________________________________________   ED ECG REPORT I, Concha SeMary E Ramond Darnell, the attending physician, personally viewed and interpreted this ECG.  EKG is normal sinus rate of 72, 1 mm ST elevation in V2 through V6, 2 and aVF most likely secondary to early repolarization, normal intervals, no T wave inversion ____________________________________________  RADIOLOGY I, Concha SeMary E Sheala Dosh, personally viewed and evaluated these images (plain radiographs) as part of my medical decision making, as well as reviewing the written report by the radiologist.  ED MD interpretation: No pneumonia  Official radiology report(s): Dg Chest 2 View  Result Date: 05/28/2019 CLINICAL DATA:  pt reports rt sided HA since last night; st syncopal episode AND fell last Monday in [place] and again last night while standing outside behind car; st hx HA EXAM: CHEST - 2 VIEW COMPARISON:  Chest radiographs 01/12/2018, 04/21/2017 FINDINGS: The heart size and mediastinal contours are within normal limits. The lungs are clear. No pneumothorax or pleural effusion. The visualized skeletal structures are unremarkable. IMPRESSION: Normal chest radiograph. Electronically Signed   By: Emmaline KluverNancy  Ballantyne M.D.   On: 05/28/2019 21:27   Ct Head Wo Contrast  Result Date: 05/28/2019 CLINICAL DATA:  Patient ambulatory to triage with steady gait, without difficulty or distress noted; pt reports rt sided HA since last night; st syncopal episode AND fell last Monday in [place] EXAM: CT HEAD  WITHOUT CONTRAST TECHNIQUE: Contiguous axial images were obtained from the base of the skull through the vertex without intravenous contrast. COMPARISON:  None. FINDINGS: Brain: No evidence of acute infarction, hemorrhage, hydrocephalus, extra-axial collection or mass lesion/mass effect. Vascular: No hyperdense vessel or unexpected calcification. Skull: Normal. Negative for fracture or focal lesion. Sinuses/Orbits: No acute finding. Other: None. IMPRESSION: No acute intracranial process. Electronically Signed   By: Emmaline KluverNancy  Ballantyne M.D.   On: 05/28/2019 21:25    ____________________________________________   PROCEDURES  Procedure(s) performed (including Critical Care):  Procedures   ____________________________________________   INITIAL IMPRESSION / ASSESSMENT AND PLAN / ED COURSE  Chelsey Dolloff was evaluated in Emergency Department on 05/29/2019 for the symptoms described in the history of present illness. He was evaluated in the context of the global COVID-19 pandemic, which necessitated consideration that the patient might be at risk for infection with the SARS-CoV-2 virus that causes COVID-19. Institutional protocols and algorithms that pertain to the evaluation of patients at risk for COVID-19 are in a state of rapid change based on information released by  regulatory bodies including the CDC and federal and state organizations. These policies and algorithms were followed during the patient's care in the ED.    Patient presents with 2 episodes of syncope and headache.  This most concerning for subarachnoid hemorrhage.  I had lengthy discussion with patient that the CT head would only a low sensitivity given he is now 2 days out from the headache, however CT head was ordered in triage to evaluate for intracranial hemorrhage, mass.  Also given the sudden onset of passing out without any prodrome a second time consider heart issues such as ACS, HOCM.  Will get EKG and cardiac marker to  further evaluate.  Will get labs to evaluate for anemia although lower suspicion given no symptoms to suggest this.   White count is normal no evidence of infection.  Hemoglobin is normal therefore low suspicion for bleed.  Slightly low potassium at 3.3 but normal kidney function.  Cardiac markers are negative.  Chest x-ray without evidence of pneumonia.  CT scan without evidence of intracranial hemorrhage.   I discussed with patient that the CT scan has a lower sensitivity given this happened on Monday and my concern for subarachnoid hemorrhage.  I discussed getting a lumbar puncture as the gold standard but patient declined.  I then offered CTA to rule out aneurysm.  Patient discussed with his wife over the phone who said that she wanted him to come home and not get any further work-up. I really encouraged pt to at least get the CTA but pt declined again.  I discussed the risk of death and permanent disability if this is secondary to a bleed.  Patient expressed understanding but said that he did not want further work-up at this time.  Patient has capacity make this decision.  I also discussed the possibility of this being related to his heart and offered admission for cardiac monitoring and echocardiogram.  Again patient declined this as well.  Will give patient neurology and cardiology's phone number for follow-up.  I discussed avoiding NSAIDs and ibuprofen for the headaches and to return to the ER if he elects to have further work-up or if it happens again.    ____________________________________________   FINAL CLINICAL IMPRESSION(S) / ED DIAGNOSES   Final diagnoses:  Syncope and collapse  Headache disorder      MEDICATIONS GIVEN DURING THIS VISIT:  Medications - No data to display   ED Discharge Orders    None       Note:  This document was prepared using Dragon voice recognition software and may include unintentional dictation errors.   Vanessa Muldraugh, MD 05/29/19 [phone]

## 2019-08-29 ENCOUNTER — Other Ambulatory Visit: Payer: Self-pay

## 2019-08-29 ENCOUNTER — Emergency Department
Admission: EM | Admit: 2019-08-29 | Discharge: 2019-08-29 | Discharge status: Against Medical Advice | Payer: Medicaid Other | Attending: Emergency Medicine | Admitting: Emergency Medicine

## 2019-08-29 ENCOUNTER — Emergency Department: Payer: Medicaid Other

## 2019-08-29 DIAGNOSIS — F17210 Nicotine dependence, cigarettes, uncomplicated: Secondary | ICD-10-CM | POA: Insufficient documentation

## 2019-08-29 DIAGNOSIS — Z5321 Procedure and treatment not carried out due to patient leaving prior to being seen by health care provider: Secondary | ICD-10-CM | POA: Insufficient documentation

## 2019-08-29 DIAGNOSIS — R0789 Other chest pain: Secondary | ICD-10-CM | POA: Insufficient documentation

## 2019-08-29 LAB — CBC
HCT: 40.8 % (ref 39.0–52.0)
Hemoglobin: 13.4 g/dL (ref 13.0–17.0)
MCH: 23.9 pg — ABNORMAL LOW (ref 26.0–34.0)
MCHC: 32.8 g/dL (ref 30.0–36.0)
MCV: 72.9 fL — ABNORMAL LOW (ref 80.0–100.0)
Platelets: 274 10*3/uL (ref 150–400)
RBC: 5.6 MIL/uL (ref 4.22–5.81)
RDW: 14.8 % (ref 11.5–15.5)
WBC: 11.2 10*3/uL — ABNORMAL HIGH (ref 4.0–10.5)
nRBC: 0 % (ref 0.0–0.2)

## 2019-08-29 LAB — BASIC METABOLIC PANEL
Anion gap: 8 (ref 5–15)
BUN: 19 mg/dL (ref 6–20)
CO2: 27 mmol/L (ref 22–32)
Calcium: 9.4 mg/dL (ref 8.9–10.3)
Chloride: 105 mmol/L (ref 98–111)
Creatinine, Ser: 1.21 mg/dL (ref 0.61–1.24)
GFR calc Af Amer: >60 mL/min (ref >60)
GFR calc non Af Amer: >60 mL/min (ref >60)
Glucose, Bld: 84 mg/dL (ref 70–99)
Potassium: 3.7 mmol/L (ref 3.5–5.1)
Sodium: 140 mmol/L (ref 135–145)

## 2019-08-29 LAB — TROPONIN I (HIGH SENSITIVITY): Troponin I (High Sensitivity): 4 ng/L (ref <18)

## 2019-08-29 MED ORDER — SODIUM CHLORIDE 0.9% FLUSH: 3.0000 mL | Freq: Once | INTRAVENOUS | Status: DC

## 2019-08-29 NOTE — ED Notes (Signed)
No answer when called several times from lobby 

## 2019-08-29 NOTE — ED Provider Notes (Signed)
[facility] Emergency Department Provider Note  ____________________________________________  Time seen: Approximately 6:21 PM  The following is a medical screening exam note. It is intended that the patient await an ER room assignment for detailed exam, diagnosis, and disposition.  I have reviewed the triage vital signs.    HISTORY  Chief Complaint No chief complaint on file.   HPI Randy Gillespie is a 28 y.o. male presents to the emergency department for treatment and evaluation of chest pain. Pain is in the upper left chest. Started last night. Pain has been constant and is like a cramp. No worse with movement. No relief with Excedrin.  Usually ibuprofen 600mg  helps, but he is out.   No past medical history on file.  There are no active problems to display for this patient.   No past surgical history on file.  Prior to Admission medications   Medication Sig Start Date End Date Taking? Authorizing Provider  ibuprofen (ADVIL,MOTRIN) 800 MG tablet Take 1 tablet (800 mg total) by mouth 3 (three) times daily. 01/30/18   Johnn Hai, PA-C    Allergies Patient has no known allergies.  No family history on file.  Social History Social History   Tobacco Use  . Smoking status: Current Every Day Smoker    Packs/day: 1.00    Types: Cigarettes  . Smokeless tobacco: Never Used  Substance Use Topics  . Alcohol use: Yes    Comment: Occ  . Drug use: No    Types: Marijuana    Review of Systems Constitutional: Negative for fever. Cardiac: Positive for chest pain. ENT: Negative for sore throat. Respiratory: Negative for shortness of breath. Gastrointestinal: No abdominal pain.  No nausea, no vomiting.  No diarrhea.  Musculoskeletal: Negative for generalized body aches. Skin: Negative for rash/lesion/wound. Neurological: Negative for headaches, focal weakness or numbness.  ____________________________________________   PHYSICAL EXAM:  VITAL  SIGNS:  Today's Vitals   08/29/19 1824 08/29/19 1825  BP: 113/72   Pulse: 69   Resp: 18   Temp: 97.9 F (36.6 C)   TempSrc: Oral   SpO2: 100%   Weight:  99.8 kg  Height:  6' (1.829 m)  PainSc: 8     Body mass index is 29.84 kg/m.   Constitutional: Alert and oriented. No acute distress. Head: Atraumatic. Nose: No congestion/rhinnorhea. Mouth/Throat: Mucous membranes are moist. Neck: No stridor.  Cardiovascular: Good peripheral circulation.  Respiratory: Normal respiratory effort. Musculoskeletal: No restriction Neurologic:  Normal speech and language. No gross focal neurologic deficits are appreciated. Speech is normal. No gait instability. Skin:  Skin is warm, dry and intact. No rash noted. Psychiatric: Mood and affect are normal. Speech and behavior are normal.  ____________________________________________   LABS (all labs ordered are listed, but only abnormal results are displayed)  Labs Reviewed - No data to display ____________________________________________  EKG  Viewed by MD ____________________________________________   INITIAL CLINICAL IMPRESSION(S)   28 year old male presents with chest pain. He was advised to wait in the waiting room until labs are back and ER room is available. He is to notify staff for any acute changes.    Victorino Dike, FNP 08/29/19 2101    Delman Kitten, MD 08/29/19 2125

## 2019-08-29 NOTE — ED Triage Notes (Signed)
Pt to the er for upper left chest pain. Pt says he has been here 10 times for chest pain. Pt states it feels like a cramp. Pt took excedrin earlier with no change.

## 2021-06-15 ENCOUNTER — Emergency Department: Payer: Medicaid Other

## 2021-06-15 ENCOUNTER — Emergency Department
Admission: EM | Admit: 2021-06-15 | Discharge: 2021-06-15 | Disposition: A | Discharge status: Home / Self Care | Payer: Medicaid Other | Attending: Emergency Medicine | Admitting: Emergency Medicine

## 2021-06-15 ENCOUNTER — Other Ambulatory Visit: Payer: Self-pay

## 2021-06-15 DIAGNOSIS — S29011A Strain of muscle and tendon of front wall of thorax, initial encounter: Secondary | ICD-10-CM | POA: Insufficient documentation

## 2021-06-15 DIAGNOSIS — Y93B9 Activity, other involving muscle strengthening exercises: Secondary | ICD-10-CM | POA: Insufficient documentation

## 2021-06-15 DIAGNOSIS — T148XXA Other injury of unspecified body region, initial encounter: Secondary | ICD-10-CM

## 2021-06-15 DIAGNOSIS — F17210 Nicotine dependence, cigarettes, uncomplicated: Secondary | ICD-10-CM | POA: Insufficient documentation

## 2021-06-15 DIAGNOSIS — X500XXA Overexertion from strenuous movement or load, initial encounter: Secondary | ICD-10-CM | POA: Insufficient documentation

## 2021-06-15 MED ORDER — BACLOFEN 10 MG PO TABS: 10.0000 mg | ORAL_TABLET | Freq: Three times a day (TID) | ORAL | 0 refills | Status: AC

## 2021-06-15 MED ORDER — MELOXICAM 15 MG PO TABS: 15.0000 mg | ORAL_TABLET | Freq: Every day | ORAL | 2 refills | Status: AC

## 2021-06-15 NOTE — ED Triage Notes (Signed)
Pt comes with c/o left sided rib pain for about two weeks. Pt states he does workout and lifts heavy equipment at work. Pt unsure if he injured it.

## 2021-06-15 NOTE — ED Provider Notes (Signed)
[facility] Emergency Department Provider Note  ____________________________________________   Event Date/Time   First MD Initiated Contact with Patient 06/15/21 [phone]     (approximate)  I have reviewed the triage vital signs and the nursing notes.   HISTORY  Chief Complaint Rib Injury    HPI Randy Gillespie is a 30 y.o. male presents emergency department complaining of left rib pain.  Patient states he has been working out and does do a lot of lifting.  States he has had pain for this area for several days.  Approximately 2 weeks actually.  No fever or chills.  No chest shortness of breath.  No abdominal  History reviewed. No pertinent past medical history.  There are no problems to display for this patient.   History reviewed. No pertinent surgical history.  Prior to Admission medications   Medication Sig Start Date End Date Taking? Authorizing Provider  baclofen (LIORESAL) 10 MG tablet Take 1 tablet (10 mg total) by mouth 3 (three) times daily for 7 days. 06/15/21 06/22/21 Yes Sherley Mckenney, Roselyn Bering, PA-C  meloxicam (MOBIC) 15 MG tablet Take 1 tablet (15 mg total) by mouth daily. 06/15/21 06/15/22 Yes [place]ceola Holian, Roselyn Bering, PA-C    Allergies Patient has no known allergies.  No family history on file.  Social History Social History   Tobacco Use   Smoking status: Every Day    Packs/day: 1.00    Types: Cigarettes   Smokeless tobacco: Never  Substance Use Topics   Alcohol use: Not Currently    Comment: Occ   Drug use: No    Types: Marijuana    Review of Systems  Constitutional: No fever/chills Eyes: No visual changes. ENT: No sore throat. Respiratory: Denies cough Cardiovascular: Denies chest pain Gastrointestinal: Denies abdominal pain Genitourinary: Negative for dysuria. Musculoskeletal: Negative for back pain. Skin: Negative for rash. Psychiatric: no mood changes,     ____________________________________________   PHYSICAL  EXAM:  VITAL SIGNS: ED Triage Vitals  Enc Vitals Group     BP 06/15/21 0721 (!) 118/53     Pulse Rate 06/15/21 0721 60     Resp 06/15/21 0721 18     Temp 06/15/21 0721 98 F (36.7 C)     Temp src --      SpO2 06/15/21 0721 100 %     Weight 06/15/21 0803 220 lb 0.3 oz (99.8 kg)     Height 06/15/21 0803 6' (1.829 m)     Head Circumference --      Peak Flow --      Pain Score 06/15/21 0719 8     Pain Loc --      Pain Edu? --      Excl. in GC? --     Constitutional: Alert and oriented. Well appearing and in no acute distress. Eyes: Conjunctivae are normal.  Head: Atraumatic. Nose: No congestion/rhinnorhea. Mouth/Throat: Mucous membranes are moist.   Neck:  supple no lymphadenopathy noted Cardiovascular: Normal rate, regular rhythm. Heart sounds are normal Respiratory: Normal respiratory effort.  No retractions, lungs c t a, ribs are nontender Abd: soft nontender bs normal all 4 quad GU: deferred Musculoskeletal: FROM all extremities, warm and well perfused Neurologic:  Normal speech and language.  Skin:  Skin is warm, dry and intact. No rash noted. Psychiatric: Mood and affect are normal. Speech and behavior are normal.  ____________________________________________   LABS (all labs ordered are listed, but only abnormal results are displayed)  Labs Reviewed - No data to  display ____________________________________________   ____________________________________________  RADIOLOGY  X-ray left ribs with chest  ____________________________________________   PROCEDURES  Procedure(s) performed: No  Procedures    ____________________________________________   INITIAL IMPRESSION / ASSESSMENT AND PLAN / ED COURSE  Pertinent labs & imaging results that were available during my care of the patient were reviewed by me and considered in my medical decision making (see chart for details).   Patient is a 30 year old F presents with left rib pain.  See HPI.  Physical  exam shows patient per stable.  Exam is more consistent with muscle strain.  He is placed on meloxicam and baclofen.  Apply ice to the area.  Careful with lifting. Return to the worsening.  Follow-up with an urgent care his regular doctor if not improving in 1 week.  He was discharged stable condition     Randy Gillespie was evaluated in Emergency Department on 06/15/2021 for the symptoms described in the history of present illness. He was evaluated in the context of the global COVID-19 pandemic, which necessitated consideration that the patient might be at risk for infection with the SARS-CoV-2 virus that causes COVID-19. Institutional protocols and algorithms that pertain to the evaluation of patients at risk for COVID-19 are in a state of rapid change based on information released by regulatory bodies including the CDC and federal and state organizations. These policies and algorithms were followed during the patient's care in the ED.    As part of my medical decision making, I reviewed the following data within the electronic MEDICAL RECORD NUMBER Nursing notes reviewed and incorporated, Old chart reviewed, Radiograph reviewed , Notes from prior ED visits, and [place] Controlled Substance Database  ____________________________________________   FINAL CLINICAL IMPRESSION(S) / ED DIAGNOSES  Final diagnoses:  Muscle strain      NEW MEDICATIONS STARTED DURING THIS VISIT:  New Prescriptions   BACLOFEN (LIORESAL) 10 MG TABLET    Take 1 tablet (10 mg total) by mouth 3 (three) times daily for 7 days.   MELOXICAM (MOBIC) 15 MG TABLET    Take 1 tablet (15 mg total) by mouth daily.     Note:  This document was prepared using Dragon voice recognition software and may include unintentional dictation errors.    Faythe Ghee, PA-C 06/15/21 2993    Shaune Pollack, MD 06/16/21 [phone]

## 2021-06-15 NOTE — ED Notes (Signed)
See triage note   Presents with pain to left side of chest/rib area  states pain started about 2 weeks ago  denies any injury but has been working out
